# Patient Record
Sex: Male | Born: 1955 | Race: White | Hispanic: No | Marital: Single | Smoking: Never smoker
Health system: Southern US, Community
[De-identification: ages and names within clinical notes are randomized; demographics above are authoritative.]

## PROBLEM LIST (undated history)

## (undated) DIAGNOSIS — I219 Acute myocardial infarction, unspecified: Secondary | ICD-10-CM

## (undated) DIAGNOSIS — N289 Disorder of kidney and ureter, unspecified: Secondary | ICD-10-CM

## (undated) DIAGNOSIS — K859 Acute pancreatitis without necrosis or infection, unspecified: Secondary | ICD-10-CM

## (undated) DIAGNOSIS — I2510 Atherosclerotic heart disease of native coronary artery without angina pectoris: Secondary | ICD-10-CM

## (undated) DIAGNOSIS — E119 Type 2 diabetes mellitus without complications: Secondary | ICD-10-CM

## (undated) HISTORY — PX: AV FISTULA PLACEMENT, BRACHIOBASILIC: SHX1206

## (undated) HISTORY — PX: CARDIAC DEFIBRILLATOR PLACEMENT: SHX171

---

## 2014-09-15 DIAGNOSIS — I219 Acute myocardial infarction, unspecified: Secondary | ICD-10-CM

## 2014-09-15 HISTORY — DX: Acute myocardial infarction, unspecified: I21.9

## 2014-12-02 ENCOUNTER — Emergency Department (HOSPITAL_COMMUNITY): Payer: Medicaid - Out of State

## 2014-12-02 ENCOUNTER — Inpatient Hospital Stay (HOSPITAL_COMMUNITY): Payer: Medicaid - Out of State

## 2014-12-02 ENCOUNTER — Inpatient Hospital Stay (HOSPITAL_COMMUNITY)
Admission: EM | Admit: 2014-12-02 | Discharge: 2014-12-05 | DRG: 280 | Disposition: A | Discharge status: Home / Self Care | Payer: Medicaid - Out of State | Attending: Internal Medicine | Admitting: Internal Medicine

## 2014-12-02 ENCOUNTER — Encounter (HOSPITAL_COMMUNITY): Payer: Self-pay | Admitting: Oncology

## 2014-12-02 DIAGNOSIS — I5022 Chronic systolic (congestive) heart failure: Secondary | ICD-10-CM | POA: Diagnosis present

## 2014-12-02 DIAGNOSIS — I5021 Acute systolic (congestive) heart failure: Secondary | ICD-10-CM

## 2014-12-02 DIAGNOSIS — D638 Anemia in other chronic diseases classified elsewhere: Secondary | ICD-10-CM | POA: Diagnosis present

## 2014-12-02 DIAGNOSIS — Z79899 Other long term (current) drug therapy: Secondary | ICD-10-CM | POA: Diagnosis not present

## 2014-12-02 DIAGNOSIS — I5023 Acute on chronic systolic (congestive) heart failure: Secondary | ICD-10-CM | POA: Diagnosis present

## 2014-12-02 DIAGNOSIS — N19 Unspecified kidney failure: Secondary | ICD-10-CM

## 2014-12-02 DIAGNOSIS — I214 Non-ST elevation (NSTEMI) myocardial infarction: Principal | ICD-10-CM | POA: Insufficient documentation

## 2014-12-02 DIAGNOSIS — R251 Tremor, unspecified: Secondary | ICD-10-CM | POA: Diagnosis present

## 2014-12-02 DIAGNOSIS — Z794 Long term (current) use of insulin: Secondary | ICD-10-CM

## 2014-12-02 DIAGNOSIS — I252 Old myocardial infarction: Secondary | ICD-10-CM | POA: Diagnosis not present

## 2014-12-02 DIAGNOSIS — J810 Acute pulmonary edema: Secondary | ICD-10-CM

## 2014-12-02 DIAGNOSIS — R945 Abnormal results of liver function studies: Secondary | ICD-10-CM

## 2014-12-02 DIAGNOSIS — R0600 Dyspnea, unspecified: Secondary | ICD-10-CM | POA: Diagnosis not present

## 2014-12-02 DIAGNOSIS — R110 Nausea: Secondary | ICD-10-CM | POA: Diagnosis not present

## 2014-12-02 DIAGNOSIS — N183 Chronic kidney disease, stage 3 unspecified: Secondary | ICD-10-CM | POA: Insufficient documentation

## 2014-12-02 DIAGNOSIS — R627 Adult failure to thrive: Secondary | ICD-10-CM | POA: Diagnosis present

## 2014-12-02 DIAGNOSIS — R42 Dizziness and giddiness: Secondary | ICD-10-CM | POA: Diagnosis not present

## 2014-12-02 DIAGNOSIS — I255 Ischemic cardiomyopathy: Secondary | ICD-10-CM | POA: Diagnosis present

## 2014-12-02 DIAGNOSIS — E1122 Type 2 diabetes mellitus with diabetic chronic kidney disease: Secondary | ICD-10-CM | POA: Diagnosis present

## 2014-12-02 DIAGNOSIS — D631 Anemia in chronic kidney disease: Secondary | ICD-10-CM | POA: Diagnosis present

## 2014-12-02 DIAGNOSIS — R112 Nausea with vomiting, unspecified: Secondary | ICD-10-CM | POA: Diagnosis present

## 2014-12-02 DIAGNOSIS — N184 Chronic kidney disease, stage 4 (severe): Secondary | ICD-10-CM | POA: Diagnosis present

## 2014-12-02 DIAGNOSIS — R1111 Vomiting without nausea: Secondary | ICD-10-CM

## 2014-12-02 DIAGNOSIS — I509 Heart failure, unspecified: Secondary | ICD-10-CM

## 2014-12-02 DIAGNOSIS — Z7982 Long term (current) use of aspirin: Secondary | ICD-10-CM

## 2014-12-02 DIAGNOSIS — D649 Anemia, unspecified: Secondary | ICD-10-CM

## 2014-12-02 DIAGNOSIS — Z95810 Presence of automatic (implantable) cardiac defibrillator: Secondary | ICD-10-CM

## 2014-12-02 DIAGNOSIS — R7989 Other specified abnormal findings of blood chemistry: Secondary | ICD-10-CM | POA: Diagnosis not present

## 2014-12-02 DIAGNOSIS — I25119 Atherosclerotic heart disease of native coronary artery with unspecified angina pectoris: Secondary | ICD-10-CM | POA: Diagnosis present

## 2014-12-02 DIAGNOSIS — Z8249 Family history of ischemic heart disease and other diseases of the circulatory system: Secondary | ICD-10-CM | POA: Diagnosis not present

## 2014-12-02 DIAGNOSIS — I2510 Atherosclerotic heart disease of native coronary artery without angina pectoris: Secondary | ICD-10-CM | POA: Diagnosis present

## 2014-12-02 DIAGNOSIS — Z833 Family history of diabetes mellitus: Secondary | ICD-10-CM | POA: Diagnosis not present

## 2014-12-02 DIAGNOSIS — J811 Chronic pulmonary edema: Secondary | ICD-10-CM | POA: Diagnosis present

## 2014-12-02 DIAGNOSIS — R9431 Abnormal electrocardiogram [ECG] [EKG]: Secondary | ICD-10-CM

## 2014-12-02 DIAGNOSIS — R778 Other specified abnormalities of plasma proteins: Secondary | ICD-10-CM

## 2014-12-02 HISTORY — DX: Disorder of kidney and ureter, unspecified: N28.9

## 2014-12-02 HISTORY — DX: Acute pancreatitis without necrosis or infection, unspecified: K85.90

## 2014-12-02 HISTORY — DX: Acute myocardial infarction, unspecified: I21.9

## 2014-12-02 HISTORY — DX: Type 2 diabetes mellitus without complications: E11.9

## 2014-12-02 HISTORY — DX: Atherosclerotic heart disease of native coronary artery without angina pectoris: I25.10

## 2014-12-02 LAB — BRAIN NATRIURETIC PEPTIDE: B Natriuretic Peptide: 1416.1 pg/mL — ABNORMAL HIGH (ref 0.0–100.0)

## 2014-12-02 LAB — COMPREHENSIVE METABOLIC PANEL
ALBUMIN: 4.2 g/dL (ref 3.5–5.0)
ALK PHOS: 102 U/L (ref 38–126)
ALT: 97 U/L — AB (ref 17–63)
ANION GAP: 11 (ref 5–15)
AST: 93 U/L — ABNORMAL HIGH (ref 15–41)
BUN: 70 mg/dL — ABNORMAL HIGH (ref 6–20)
CALCIUM: 9.9 mg/dL (ref 8.9–10.3)
CHLORIDE: 103 mmol/L (ref 101–111)
CO2: 28 mmol/L (ref 22–32)
CREATININE: 2.81 mg/dL — AB (ref 0.61–1.24)
GFR calc Af Amer: 27 mL/min — ABNORMAL LOW (ref >60)
GFR calc non Af Amer: 23 mL/min — ABNORMAL LOW (ref >60)
GLUCOSE: 94 mg/dL (ref 65–99)
Potassium: 3.8 mmol/L (ref 3.5–5.1)
SODIUM: 142 mmol/L (ref 135–145)
Total Bilirubin: 1 mg/dL (ref 0.3–1.2)
Total Protein: 7.6 g/dL (ref 6.5–8.1)

## 2014-12-02 LAB — I-STAT CHEM 8, ED
BUN: 63 mg/dL — ABNORMAL HIGH (ref 6–20)
Calcium, Ion: 1.15 mmol/L (ref 1.13–1.30)
Chloride: 102 mmol/L (ref 101–111)
Creatinine, Ser: 2.8 mg/dL — ABNORMAL HIGH (ref 0.61–1.24)
Glucose, Bld: 93 mg/dL (ref 65–99)
HCT: 30 % — ABNORMAL LOW (ref 39.0–52.0)
HEMOGLOBIN: 10.2 g/dL — AB (ref 13.0–17.0)
POTASSIUM: 3.4 mmol/L — AB (ref 3.5–5.1)
SODIUM: 141 mmol/L (ref 135–145)
TCO2: 27 mmol/L (ref 0–100)

## 2014-12-02 LAB — RAPID URINE DRUG SCREEN, HOSP PERFORMED
AMPHETAMINES: NOT DETECTED
BENZODIAZEPINES: NOT DETECTED
Barbiturates: NOT DETECTED
COCAINE: NOT DETECTED
OPIATES: NOT DETECTED
Tetrahydrocannabinol: NOT DETECTED

## 2014-12-02 LAB — MAGNESIUM: Magnesium: 2.1 mg/dL (ref 1.7–2.4)

## 2014-12-02 LAB — TROPONIN I
TROPONIN I: 1.48 ng/mL — AB (ref <0.031)
Troponin I: 0.61 ng/mL (ref <0.031)
Troponin I: 2.3 ng/mL (ref <0.031)

## 2014-12-02 LAB — CREATININE, SERUM
CREATININE: 2.68 mg/dL — AB (ref 0.61–1.24)
GFR calc Af Amer: 28 mL/min — ABNORMAL LOW (ref >60)
GFR, EST NON AFRICAN AMERICAN: 24 mL/min — AB (ref >60)

## 2014-12-02 LAB — CBC
HCT: 31.8 % — ABNORMAL LOW (ref 39.0–52.0)
HEMOGLOBIN: 10.7 g/dL — AB (ref 13.0–17.0)
MCH: 32 pg (ref 26.0–34.0)
MCHC: 33.6 g/dL (ref 30.0–36.0)
MCV: 95.2 fL (ref 78.0–100.0)
Platelets: 133 10*3/uL — ABNORMAL LOW (ref 150–400)
RBC: 3.34 MIL/uL — ABNORMAL LOW (ref 4.22–5.81)
RDW: 15 % (ref 11.5–15.5)
WBC: 7.1 10*3/uL (ref 4.0–10.5)

## 2014-12-02 LAB — CBG MONITORING, ED
GLUCOSE-CAPILLARY: 181 mg/dL — AB (ref 65–99)
GLUCOSE-CAPILLARY: 206 mg/dL — AB (ref 65–99)
Glucose-Capillary: 171 mg/dL — ABNORMAL HIGH (ref 65–99)

## 2014-12-02 LAB — URINALYSIS, ROUTINE W REFLEX MICROSCOPIC
Bilirubin Urine: NEGATIVE
GLUCOSE, UA: NEGATIVE mg/dL
HGB URINE DIPSTICK: NEGATIVE
Ketones, ur: NEGATIVE mg/dL
Leukocytes, UA: NEGATIVE
Nitrite: NEGATIVE
PH: 6 (ref 5.0–8.0)
PROTEIN: NEGATIVE mg/dL
Specific Gravity, Urine: 1.012 (ref 1.005–1.030)
Urobilinogen, UA: 0.2 mg/dL (ref 0.0–1.0)

## 2014-12-02 LAB — APTT: APTT: 31 s (ref 24–37)

## 2014-12-02 LAB — I-STAT TROPONIN, ED: Troponin i, poc: 0.49 ng/mL (ref 0.00–0.08)

## 2014-12-02 LAB — HEPARIN LEVEL (UNFRACTIONATED): HEPARIN UNFRACTIONATED: 0.23 [IU]/mL — AB (ref 0.30–0.70)

## 2014-12-02 LAB — GLUCOSE, CAPILLARY
GLUCOSE-CAPILLARY: 334 mg/dL — AB (ref 65–99)
Glucose-Capillary: 176 mg/dL — ABNORMAL HIGH (ref 65–99)
Glucose-Capillary: 249 mg/dL — ABNORMAL HIGH (ref 65–99)

## 2014-12-02 LAB — PROTIME-INR
INR: 1.02 (ref 0.00–1.49)
PROTHROMBIN TIME: 13.6 s (ref 11.6–15.2)

## 2014-12-02 LAB — LIPASE, BLOOD: Lipase: 16 U/L — ABNORMAL LOW (ref 22–51)

## 2014-12-02 MED ORDER — ASPIRIN 81 MG PO CHEW
324.0000 mg | CHEWABLE_TABLET | Freq: Once | ORAL | Status: AC
  Administered 2014-12-02: 324 mg via ORAL
  Filled 2014-12-02: qty 4

## 2014-12-02 MED ORDER — INSULIN DETEMIR 100 UNIT/ML ~~LOC~~ SOLN
5.0000 [IU] | Freq: Two times a day (BID) | SUBCUTANEOUS | Status: DC
  Administered 2014-12-02 – 2014-12-05 (×6): 5 [IU] via SUBCUTANEOUS
  Filled 2014-12-02 (×8): qty 0.05

## 2014-12-02 MED ORDER — FLUOXETINE HCL 20 MG PO CAPS
40.0000 mg | ORAL_CAPSULE | Freq: Every day | ORAL | Status: DC
  Administered 2014-12-03 – 2014-12-05 (×3): 40 mg via ORAL
  Filled 2014-12-02 (×4): qty 2

## 2014-12-02 MED ORDER — ALBUTEROL SULFATE (2.5 MG/3ML) 0.083% IN NEBU
5.0000 mg | INHALATION_SOLUTION | Freq: Once | RESPIRATORY_TRACT | Status: AC
  Administered 2014-12-02: 5 mg via RESPIRATORY_TRACT
  Filled 2014-12-02: qty 6

## 2014-12-02 MED ORDER — SODIUM CHLORIDE 0.9 % IJ SOLN
3.0000 mL | Freq: Two times a day (BID) | INTRAMUSCULAR | Status: DC
  Administered 2014-12-05: 3 mL via INTRAVENOUS

## 2014-12-02 MED ORDER — PANCRELIPASE (LIP-PROT-AMYL) 6000-19000 UNITS PO CPEP
6000.0000 [IU] | ORAL_CAPSULE | ORAL | Status: DC
  Administered 2014-12-03 – 2014-12-05 (×3): 6000 [IU] via ORAL

## 2014-12-02 MED ORDER — BISACODYL 10 MG RE SUPP: 10.0000 mg | Freq: Every day | RECTAL | Status: DC | PRN

## 2014-12-02 MED ORDER — CALCITRIOL 0.5 MCG PO CAPS
0.5000 ug | ORAL_CAPSULE | Freq: Every day | ORAL | Status: DC
  Administered 2014-12-03 – 2014-12-05 (×3): 0.5 ug via ORAL
  Filled 2014-12-02 (×4): qty 1

## 2014-12-02 MED ORDER — PANCRELIPASE (LIP-PROT-AMYL) 6000-19000 UNITS PO CPEP: 6000.0000 [IU] | ORAL_CAPSULE | Freq: Two times a day (BID) | ORAL | Status: DC

## 2014-12-02 MED ORDER — FUROSEMIDE 10 MG/ML IJ SOLN
40.0000 mg | Freq: Once | INTRAMUSCULAR | Status: AC
  Administered 2014-12-02: 40 mg via INTRAVENOUS
  Filled 2014-12-02: qty 4

## 2014-12-02 MED ORDER — ASPIRIN EC 81 MG PO TBEC
81.0000 mg | DELAYED_RELEASE_TABLET | Freq: Every day | ORAL | Status: DC
  Administered 2014-12-03 – 2014-12-05 (×3): 81 mg via ORAL
  Filled 2014-12-02 (×3): qty 1

## 2014-12-02 MED ORDER — ACETAMINOPHEN 650 MG RE SUPP: 650.0000 mg | Freq: Four times a day (QID) | RECTAL | Status: DC | PRN

## 2014-12-02 MED ORDER — PANCRELIPASE (LIP-PROT-AMYL) 12000-38000 UNITS PO CPEP: 6000.0000 [IU] | ORAL_CAPSULE | Freq: Three times a day (TID) | ORAL | Status: DC

## 2014-12-02 MED ORDER — ACETAMINOPHEN 325 MG PO TABS: 650.0000 mg | ORAL_TABLET | Freq: Four times a day (QID) | ORAL | Status: DC | PRN

## 2014-12-02 MED ORDER — MORPHINE SULFATE (PF) 2 MG/ML IV SOLN
1.0000 mg | INTRAVENOUS | Status: DC | PRN
  Administered 2014-12-02 – 2014-12-04 (×2): 1 mg via INTRAVENOUS
  Filled 2014-12-02 (×3): qty 1

## 2014-12-02 MED ORDER — PANCRELIPASE (LIP-PROT-AMYL) 12000-38000 UNITS PO CPEP
12000.0000 [IU] | ORAL_CAPSULE | Freq: Every day | ORAL | Status: DC
  Administered 2014-12-02: 12000 [IU] via ORAL
  Filled 2014-12-02 (×3): qty 1

## 2014-12-02 MED ORDER — INSULIN ASPART 100 UNIT/ML ~~LOC~~ SOLN
0.0000 [IU] | Freq: Three times a day (TID) | SUBCUTANEOUS | Status: DC
  Administered 2014-12-02 – 2014-12-03 (×3): 3 [IU] via SUBCUTANEOUS
  Administered 2014-12-03: 5 [IU] via SUBCUTANEOUS
  Administered 2014-12-05: 1 [IU] via SUBCUTANEOUS
  Administered 2014-12-05: 3 [IU] via SUBCUTANEOUS
  Filled 2014-12-02: qty 1

## 2014-12-02 MED ORDER — HEPARIN (PORCINE) IN NACL 100-0.45 UNIT/ML-% IJ SOLN
900.0000 [IU]/h | INTRAMUSCULAR | Status: DC
  Administered 2014-12-02: 700 [IU]/h via INTRAVENOUS
  Filled 2014-12-02 (×2): qty 250

## 2014-12-02 MED ORDER — MIDODRINE HCL 5 MG PO TABS
10.0000 mg | ORAL_TABLET | Freq: Three times a day (TID) | ORAL | Status: DC
  Administered 2014-12-02 – 2014-12-05 (×9): 10 mg via ORAL
  Filled 2014-12-02 (×13): qty 2

## 2014-12-02 MED ORDER — LORAZEPAM 1 MG PO TABS
1.0000 mg | ORAL_TABLET | Freq: Four times a day (QID) | ORAL | Status: AC | PRN
  Administered 2014-12-02: 1 mg via ORAL
  Filled 2014-12-02: qty 1

## 2014-12-02 MED ORDER — PANCRELIPASE (LIP-PROT-AMYL) 12000-38000 UNITS PO CPEP
12000.0000 [IU] | ORAL_CAPSULE | Freq: Every day | ORAL | Status: DC
  Filled 2014-12-02: qty 1

## 2014-12-02 MED ORDER — CARVEDILOL 3.125 MG PO TABS
3.1250 mg | ORAL_TABLET | Freq: Two times a day (BID) | ORAL | Status: DC
  Administered 2014-12-02 – 2014-12-05 (×6): 3.125 mg via ORAL
  Filled 2014-12-02 (×9): qty 1

## 2014-12-02 MED ORDER — ONDANSETRON HCL 4 MG/2ML IJ SOLN
4.0000 mg | Freq: Four times a day (QID) | INTRAMUSCULAR | Status: DC | PRN
  Administered 2014-12-03 – 2014-12-04 (×2): 4 mg via INTRAVENOUS
  Filled 2014-12-02 (×2): qty 2

## 2014-12-02 MED ORDER — ONDANSETRON HCL 4 MG PO TABS: 4.0000 mg | ORAL_TABLET | Freq: Four times a day (QID) | ORAL | Status: DC | PRN

## 2014-12-02 MED ORDER — ONDANSETRON HCL 4 MG/2ML IJ SOLN
4.0000 mg | Freq: Once | INTRAMUSCULAR | Status: AC | PRN
  Administered 2014-12-02: 4 mg via INTRAVENOUS
  Filled 2014-12-02: qty 2

## 2014-12-02 MED ORDER — VITAMIN B-1 100 MG PO TABS
100.0000 mg | ORAL_TABLET | Freq: Every day | ORAL | Status: DC
  Administered 2014-12-03 – 2014-12-05 (×3): 100 mg via ORAL
  Filled 2014-12-02 (×4): qty 1

## 2014-12-02 MED ORDER — ALBUTEROL SULFATE (2.5 MG/3ML) 0.083% IN NEBU: 2.5000 mg | INHALATION_SOLUTION | RESPIRATORY_TRACT | Status: DC | PRN

## 2014-12-02 MED ORDER — HEPARIN SODIUM (PORCINE) 5000 UNIT/ML IJ SOLN
5000.0000 [IU] | Freq: Three times a day (TID) | INTRAMUSCULAR | Status: DC
  Filled 2014-12-02 (×3): qty 1

## 2014-12-02 MED ORDER — NITROGLYCERIN 0.4 MG SL SUBL: 0.4000 mg | SUBLINGUAL_TABLET | SUBLINGUAL | Status: DC | PRN

## 2014-12-02 MED ORDER — POLYETHYLENE GLYCOL 3350 17 G PO PACK
17.0000 g | PACK | Freq: Two times a day (BID) | ORAL | Status: DC
  Administered 2014-12-02 – 2014-12-05 (×4): 17 g via ORAL
  Filled 2014-12-02 (×7): qty 1

## 2014-12-02 MED ORDER — SODIUM CHLORIDE 0.9 % IJ SOLN
3.0000 mL | Freq: Two times a day (BID) | INTRAMUSCULAR | Status: DC
  Administered 2014-12-03 – 2014-12-04 (×2): 3 mL via INTRAVENOUS

## 2014-12-02 MED ORDER — ATORVASTATIN CALCIUM 10 MG PO TABS
10.0000 mg | ORAL_TABLET | Freq: Every day | ORAL | Status: DC
  Administered 2014-12-02 – 2014-12-04 (×3): 10 mg via ORAL
  Filled 2014-12-02 (×4): qty 1

## 2014-12-02 MED ORDER — HEPARIN BOLUS VIA INFUSION
3500.0000 [IU] | Freq: Once | INTRAVENOUS | Status: AC
  Administered 2014-12-02: 3500 [IU] via INTRAVENOUS
  Filled 2014-12-02: qty 3500

## 2014-12-02 MED ORDER — SODIUM CHLORIDE 0.9 % IJ SOLN: 3.0000 mL | INTRAMUSCULAR | Status: DC | PRN

## 2014-12-02 MED ORDER — SODIUM CHLORIDE 0.9 % IV SOLN: 250.0000 mL | INTRAVENOUS | Status: DC | PRN

## 2014-12-02 MED ORDER — ADULT MULTIVITAMIN W/MINERALS CH
1.0000 | ORAL_TABLET | Freq: Every day | ORAL | Status: DC
  Administered 2014-12-03 – 2014-12-05 (×3): 1 via ORAL
  Filled 2014-12-02 (×4): qty 1

## 2014-12-02 MED ORDER — LORAZEPAM 2 MG/ML IJ SOLN
1.0000 mg | Freq: Four times a day (QID) | INTRAMUSCULAR | Status: AC | PRN
  Filled 2014-12-02: qty 1

## 2014-12-02 MED ORDER — FOLIC ACID 1 MG PO TABS
1.0000 mg | ORAL_TABLET | Freq: Every day | ORAL | Status: DC
  Administered 2014-12-03 – 2014-12-05 (×3): 1 mg via ORAL
  Filled 2014-12-02 (×4): qty 1

## 2014-12-02 MED ORDER — HYDROCODONE-ACETAMINOPHEN 5-325 MG PO TABS
1.0000 | ORAL_TABLET | ORAL | Status: DC | PRN
  Administered 2014-12-02: 2 via ORAL
  Administered 2014-12-03 – 2014-12-04 (×3): 1 via ORAL
  Filled 2014-12-02 (×2): qty 1
  Filled 2014-12-02: qty 2
  Filled 2014-12-02 (×2): qty 1
  Filled 2014-12-02 (×2): qty 2

## 2014-12-02 MED ORDER — PANTOPRAZOLE SODIUM 40 MG IV SOLR
40.0000 mg | Freq: Two times a day (BID) | INTRAVENOUS | Status: DC
  Administered 2014-12-02 – 2014-12-05 (×6): 40 mg via INTRAVENOUS
  Filled 2014-12-02 (×7): qty 40

## 2014-12-02 MED ORDER — DOCUSATE SODIUM 100 MG PO CAPS
100.0000 mg | ORAL_CAPSULE | Freq: Two times a day (BID) | ORAL | Status: DC
  Administered 2014-12-02 – 2014-12-05 (×6): 100 mg via ORAL
  Filled 2014-12-02 (×7): qty 1

## 2014-12-02 NOTE — ED Notes (Signed)
Per pt's sister pt began vomiting approximately half an hour ago.  Pt c/o nausea, denies pain.

## 2014-12-02 NOTE — Progress Notes (Signed)
CRITICAL VALUE ALERT  Critical value received: troponin  Date of notification:  12/02/14 Time of notification:  1620  Critical value read back:Yes.    Nurse who received alert:  Debroah Loop  MD notified (1st page):  [place]  Time of first page:  1628  MD notified (2nd page):  Time of second page:  Responding MD:  Time MD responded:

## 2014-12-02 NOTE — Progress Notes (Signed)
MD made aware of critical troponin- cardiology also made aware.  No new orders, will continue to monitor closely.

## 2014-12-02 NOTE — Progress Notes (Signed)
*  PRELIMINARY RESULTS* Echocardiogram 2D Echocardiogram has been performed.  Bradley [name] 12/02/2014, 3:18 PM

## 2014-12-02 NOTE — H&P (Signed)
[facility] History and Physical  Bradley [name] QQV:[id number] DOB: March 16, 1955 DOA: 12/02/2014  Referring physician: Dr Bradley [name] PCP: No primary care provider on file.   Chief Complaint: SBO, dizziness.   HPI: Bradley [name] is [name] 59 y.o. male from [place], PMH significant for  CAD, S/P defibrillator place 10-05  CKD , S/P AV fistula, he is not  On dialysis, he was on his way to [facility] to take his brother to the airport when he started to feel sick. He report SOB, dizziness, nausea. He denies chest pain, abdominal pain. Last BM was day prior to admission, no diarrhea. He has been compliant with his insulin, lasix. He denies drinking alcohol. His brother confirm that patient does not drink alcohol.  He had defibrillator place 11-19-2014.  He report pain at defibrillator site.   Evaluation in the ED; Chest X ray: Mild vascular congestion noted. Increased interstitial markings raise concern for mild interstitial edema. BNP 1416, troponin 0.49, Cr at 2.8. EKG with TW abnormalities inferior and anterior lead. No prior record  Available.    Review of Systems:  Negative, except as per HPI  Past Medical History  Diagnosis Date  . Diabetes mellitus without complication (HCC)     Type II  . MI (myocardial infarction) (HCC) 8/16  . Renal disorder     CKD  . CAD (coronary artery disease)   . Pancreatitis    Past Surgical History  Procedure Laterality Date  . Cardiac defibrillator placement    . Av fistula placement, brachiobasilic     Social History:  reports that he has never smoked. He has never used smokeless tobacco. He reports that he drinks alcohol. He reports that he does not use illicit drugs.  No Known Allergies  Family History: Father with history of Heart diseases. Brother: DM  Prior to Admission medications   Medication Sig Start Date End Date Taking? Authorizing Provider  aspirin EC 81 MG tablet Take 81 mg by mouth daily.   Yes Historical Provider, MD    atorvastatin (LIPITOR) 10 MG tablet Take 10 mg by mouth daily.   Yes Historical Provider, MD  calcitRIOL (ROCALTROL) 0.25 MCG capsule Take 0.5 mcg by mouth daily.   Yes Historical Provider, MD  carvedilol (COREG) 3.125 MG tablet Take 3.125 mg by mouth 2 (two) times daily with [name] meal.   Yes Historical Provider, MD  famotidine (PEPCID) 20 MG tablet Take 20 mg by mouth daily.   Yes Historical Provider, MD  ferrous sulfate 325 (65 FE) MG tablet Take 325 mg by mouth 2 (two) times daily with [name] meal.   Yes Historical Provider, MD  FLUoxetine (PROZAC) 40 MG capsule Take 40 mg by mouth daily.   Yes Historical Provider, MD  furosemide (LASIX) 20 MG tablet Take 20 mg by mouth daily.   Yes Historical Provider, MD  insulin aspart (NOVOLOG FLEXPEN) 100 UNIT/ML FlexPen Inject 5 Units into the skin 3 (three) times daily with meals. 5 units at each meal plus sliding scale.   Yes Historical Provider, MD  insulin detemir (LEVEMIR) 100 UNIT/ML injection Inject 7-9 Units into the skin 2 (two) times daily. 9 units in the morning and 7 units in the evening   Yes Historical Provider, MD  isosorbide mononitrate (IMDUR) 30 MG 24 hr tablet Take 30 mg by mouth daily.   Yes Historical Provider, MD  lipase/protease/amylase (CREON) 12000 UNITS CPEP capsule Take 6,000 Units by mouth 3 (three) times daily before meals. 2 capsules before largest meal and 1  capsule before other meals   Yes Historical Provider, MD  magnesium oxide (MAG-OX) 400 MG tablet Take 400 mg by mouth 2 (two) times daily.   Yes Historical Provider, MD  midodrine (PROAMATINE) 5 MG tablet Take 10 mg by mouth 3 (three) times daily with meals.   Yes Historical Provider, MD  potassium chloride (K-DUR) 10 MEQ tablet Take 10 mEq by mouth 2 (two) times daily.   Yes Historical Provider, MD   Physical Exam: Filed Vitals:   12/02/14 0700 12/02/14 0730 12/02/14 0800 12/02/14 0830  BP: 111/85 116/58 117/59 118/61  Pulse: 95 105 104 103  Temp:      TempSrc:      Resp:  SpO2: 99% 100% 100% 100%    Wt Readings from Last 3 Encounters:  No data found for Wt    General:  Appears calm and comfortable Eyes: PERRL, normal lids, irises & conjunctiva ENT: grossly normal hearing, lips & tongue Neck: no LAD, masses or thyromegaly Cardiovascular: RRR, no m/r/g. No LE edema.  Telemetry: SR, no arrhythmias  Respiratory:  Normal respiratory effort. Crackles bases Abdomen: soft, ntnd Skin: no rash or induration seen on limited exam Musculoskeletal: grossly normal tone BUE/BLE Psychiatric: grossly normal mood and affect, speech fluent and appropriate Neurologic: grossly non-focal. Fine tremor upper extremity.           Labs on Admission:  Basic Metabolic Panel:  Recent Labs Lab 12/02/14 0401 12/02/14 0603  NA 142 141  K 3.8 3.4*  CL 103 102  CO2 28  --   GLUCOSE 94 93  BUN 70* 63*  CREATININE 2.81* 2.80*  CALCIUM 9.9  --    Liver Function Tests:  Recent Labs Lab 12/02/14 0401  AST 93*  ALT 97*  ALKPHOS 102  BILITOT 1.0  PROT 7.6  ALBUMIN 4.2    Recent Labs Lab 12/02/14 0401  LIPASE 16*   No results for input(s): AMMONIA in the last 168 hours. CBC:  Recent Labs Lab 12/02/14 0401 12/02/14 0603  WBC 7.1  --   HGB 10.7* 10.2*  HCT 31.8* 30.0*  MCV 95.2  --   PLT 133*  --    Cardiac Enzymes: No results for input(s): CKTOTAL, CKMB, CKMBINDEX, TROPONINI in the last 168 hours.  BNP (last 3 results)  Recent Labs  12/02/14 0401  BNP 1416.1*    ProBNP (last 3 results) No results for input(s): PROBNP in the last 8760 hours.  CBG:  Recent Labs Lab 12/02/14 0830  GLUCAP 171*    Radiological Exams on Admission: Dg Abd Acute W/chest  12/02/2014  CLINICAL DATA:  Acute onset of nausea, vomiting and weakness. Initial encounter. EXAM: DG ABDOMEN ACUTE W/ 1V CHEST COMPARISON:  None. FINDINGS: The lungs are well-aerated. Mild vascular congestion is noted. Increased interstitial markings raise concern for mild  interstitial edema. There is no evidence of pleural effusion or pneumothorax. The cardiomediastinal silhouette is borderline normal in size. An AICD is noted overlying the left chest wall, with [name] single lead ending overlying the right ventricle. The visualized bowel gas pattern is unremarkable. Scattered stool and air are seen within the colon; there is no evidence of small bowel dilatation to suggest obstruction. No free intra-abdominal air is identified on the provided decubitus view. No acute osseous abnormalities are seen; the sacroiliac joints are unremarkable in appearance. Diffuse vascular calcifications are seen. IMPRESSION: 1. Mild vascular congestion noted. Increased interstitial markings raise concern for mild interstitial edema. 2. Unremarkable bowel  gas pattern; no free intra-abdominal air seen. Moderate amount of stool noted in the colon. 3. Diffuse vascular calcifications seen. Electronically Signed   By: Roanna Raider M.D.   On: 12/02/2014 06:14    EKG: Independently reviewed. T changes anterior and inferior leads.   Assessment/Plan Active Problems:   Pulmonary edema   Heart failure (HCC)   Anemia   Renal failure   Elevated troponin  1-Acute Heart failure exacerbation: suspect systolic.  S/P defibrillator.  Presents with Dyspnea, elevated BNP, Chest x ray with vascular congestion.  Received 40 mg IV lasix in the ED.  Will request records.  Cardiology consulted.   2-Elevated troponin, abnormal EKG; report dyspnea. Has history of CAD. I have ordered to request prior records.  -continue to cycle enzymes.  -ECHO ordered.  continue with aspirin, statin, nitroglycerin PRN.  -Cardiology consulted.   3-CKD stage IV; on current labs, no prior baseline. Monitor on IV lasix.   4-Diabetes: Levemir lower dose in case he is not able to tolerates diet. SSI.   5-Vomiting; KUB with constipation. Zofran PRN. IV protonix. He is on pancreatic enzymes. Lipase was negative. Bowel regime.  Unclear if history of alcohol. Per ED physician patient mention to the nurse that he drinks alcohol. Monitor for alcohol withdrawal. Patient denies to me alcohol intake. He has mild tremors.   6-Anemia; suspect anemia of CKD. Follow hb.   Code Status: presume Full Code.  DVT Prophylaxis: Heparin Family Communication: care discussed with patient and brother who was at bedside.  Disposition Plan: expect 3 to 4 days inpatient.   Time spent: 75 minutes.   Bradley [name] [facility] Pager [phone]

## 2014-12-02 NOTE — ED Provider Notes (Addendum)
CSN: [id number]     Arrival date & time 12/02/14  0306 History   First MD Initiated Contact with Patient 12/02/14 0448     Chief Complaint  Patient presents with  . Emesis     (Consider location/radiation/quality/duration/timing/severity/associated sxs/prior Treatment) Patient is a 59 y.o. male presenting with vomiting. The history is provided by the patient and the spouse.  Emesis Severity:  Moderate Duration:  1 hour Timing:  Intermittent Quality:  Stomach contents Progression:  Unchanged Chronicity:  New Recent urination:  Normal Context: not post-tussive   Relieved by:  Nothing Worsened by:  Nothing tried Ineffective treatments:  None tried Associated symptoms: no abdominal pain, no chills, no cough and no diarrhea   Risk factors: no sick contacts     Past Medical History  Diagnosis Date  . Diabetes mellitus without complication (HCC)     Type II  . MI (myocardial infarction) (HCC) 8/16  . Renal disorder     CKD  . CAD (coronary artery disease)   . Pancreatitis    Past Surgical History  Procedure Laterality Date  . Cardiac defibrillator placement    . Av fistula placement, brachiobasilic     History reviewed. No pertinent family history. Social History  Substance Use Topics  . Smoking status: Never Smoker   . Smokeless tobacco: Never Used  . Alcohol Use: Yes    Review of Systems  Constitutional: Negative for chills.  Cardiovascular: Negative for chest pain, palpitations and leg swelling.  Gastrointestinal: Positive for vomiting. Negative for abdominal pain and diarrhea.  All other systems reviewed and are negative.     Allergies  Review of patient's allergies indicates no known allergies.  Home Medications   Prior to Admission medications   Medication Sig Start Date End Date Taking? Authorizing Provider  aspirin EC 81 MG tablet Take 81 mg by mouth daily.   Yes Historical Provider, MD  atorvastatin (LIPITOR) 10 MG tablet Take 10 mg by mouth  daily.   Yes Historical Provider, MD  calcitRIOL (ROCALTROL) 0.25 MCG capsule Take 0.5 mcg by mouth daily.   Yes Historical Provider, MD  carvedilol (COREG) 3.125 MG tablet Take 3.125 mg by mouth 2 (two) times daily with a meal.   Yes Historical Provider, MD  famotidine (PEPCID) 20 MG tablet Take 20 mg by mouth daily.   Yes Historical Provider, MD  ferrous sulfate 325 (65 FE) MG tablet Take 325 mg by mouth 2 (two) times daily with a meal.   Yes Historical Provider, MD  FLUoxetine (PROZAC) 40 MG capsule Take 40 mg by mouth daily.   Yes Historical Provider, MD  furosemide (LASIX) 20 MG tablet Take 20 mg by mouth daily.   Yes Historical Provider, MD  insulin aspart (NOVOLOG FLEXPEN) 100 UNIT/ML FlexPen Inject 5 Units into the skin 3 (three) times daily with meals. 5 units at each meal plus sliding scale.   Yes Historical Provider, MD  insulin detemir (LEVEMIR) 100 UNIT/ML injection Inject 7-9 Units into the skin 2 (two) times daily. 9 units in the morning and 7 units in the evening   Yes Historical Provider, MD  isosorbide mononitrate (IMDUR) 30 MG 24 hr tablet Take 30 mg by mouth daily.   Yes Historical Provider, MD  lipase/protease/amylase (CREON) 12000 UNITS CPEP capsule Take 6,000 Units by mouth 3 (three) times daily before meals. 2 capsules before largest meal and 1 capsule before other meals   Yes Historical Provider, MD  magnesium oxide (MAG-OX) 400 MG tablet Take 400  mg by mouth 2 (two) times daily.   Yes Historical Provider, MD  midodrine (PROAMATINE) 5 MG tablet Take 10 mg by mouth 3 (three) times daily with meals.   Yes Historical Provider, MD  potassium chloride (K-DUR) 10 MEQ tablet Take 10 mEq by mouth 2 (two) times daily.   Yes Historical Provider, MD   BP 112/52 mmHg  Pulse 90  Temp(Src) 98.8 F (37.1 C) (Oral)  Resp 14  SpO2 97% Physical Exam  Constitutional: He appears well-developed and well-nourished.  HENT:  Head: Normocephalic and atraumatic.  Mouth/Throat: Oropharynx is  clear and moist.  Eyes: Conjunctivae are normal. Pupils are equal, round, and reactive to light.  Neck: Normal range of motion. Neck supple.  Cardiovascular: Normal rate, regular rhythm and intact distal pulses.   Pulmonary/Chest: Effort normal. No respiratory distress. He has decreased breath sounds.  Abdominal: Soft. Bowel sounds are normal. There is no tenderness. There is no rebound and no guarding.  Musculoskeletal: Normal range of motion.  Neurological: He is alert. He has normal reflexes.  Skin: Skin is warm and dry. He is not diaphoretic.  Psychiatric: He has a normal mood and affect.    ED Course  Procedures (including critical care time) Labs Review Labs Reviewed  LIPASE, BLOOD - Abnormal; Notable for the following:    Lipase 16 (*)    All other components within normal limits  COMPREHENSIVE METABOLIC PANEL - Abnormal; Notable for the following:    BUN 70 (*)    Creatinine, Ser 2.81 (*)    AST 93 (*)    ALT 97 (*)    GFR calc non Af Amer 23 (*)    GFR calc Af Amer 27 (*)    All other components within normal limits  CBC - Abnormal; Notable for the following:    RBC 3.34 (*)    Hemoglobin 10.7 (*)    HCT 31.8 (*)    Platelets 133 (*)    All other components within normal limits  URINALYSIS, ROUTINE W REFLEX MICROSCOPIC (NOT AT [facility]) - Abnormal; Notable for the following:    APPearance CLOUDY (*)    All other components within normal limits  I-STAT TROPOININ, ED - Abnormal; Notable for the following:    Troponin i, poc 0.49 (*)    All other components within normal limits  I-STAT CHEM 8, ED - Abnormal; Notable for the following:    Potassium 3.4 (*)    BUN 63 (*)    Creatinine, Ser 2.80 (*)    Hemoglobin 10.2 (*)    HCT 30.0 (*)    All other components within normal limits  PROTIME-INR  URINE RAPID DRUG SCREEN, HOSP PERFORMED  BRAIN NATRIURETIC PEPTIDE    Imaging Review Dg Abd Acute W/chest  12/02/2014  CLINICAL DATA:  Acute onset of nausea, vomiting and  weakness. Initial encounter. EXAM: DG ABDOMEN ACUTE W/ 1V CHEST COMPARISON:  None. FINDINGS: The lungs are well-aerated. Mild vascular congestion is noted. Increased interstitial markings raise concern for mild interstitial edema. There is no evidence of pleural effusion or pneumothorax. The cardiomediastinal silhouette is borderline normal in size. An AICD is noted overlying the left chest wall, with a single lead ending overlying the right ventricle. The visualized bowel gas pattern is unremarkable. Scattered stool and air are seen within the colon; there is no evidence of small bowel dilatation to suggest obstruction. No free intra-abdominal air is identified on the provided decubitus view. No acute osseous abnormalities are seen; the sacroiliac joints  are unremarkable in appearance. Diffuse vascular calcifications are seen. IMPRESSION: 1. Mild vascular congestion noted. Increased interstitial markings raise concern for mild interstitial edema. 2. Unremarkable bowel gas pattern; no free intra-abdominal air seen. Moderate amount of stool noted in the colon. 3. Diffuse vascular calcifications seen. Electronically Signed   By: Roanna Raider M.D.   On: 12/02/2014 06:14   I have personally reviewed and evaluated these images and lab results as part of my medical decision-making.   EKG Interpretation   Date/Time:  Tuesday December 02 2014 04:39:21 EDT Ventricular Rate:  93 PR Interval:  182 QRS Duration: 100 QT Interval:  385 QTC Calculation: 479 R Axis:   56 Text Interpretation:  Sinus rhythm non specific st t changes Confirmed by  [facility]  MD, Mileigh Tilley ([id number]) on 12/02/2014 4:47:27 AM      MDM   Final diagnoses:  None   Medications  furosemide (LASIX) injection 40 mg (not administered)  aspirin chewable tablet 324 mg (not administered)  ondansetron (ZOFRAN) injection 4 mg (4 mg Intravenous Given 12/02/14 0413)    Results for orders placed or performed during the hospital encounter of  12/02/14  Lipase, blood  Result Value Ref Range   Lipase 16 (L) 22 - 51 U/L  Comprehensive metabolic panel  Result Value Ref Range   Sodium 142 135 - 145 mmol/L   Potassium 3.8 3.5 - 5.1 mmol/L   Chloride 103 101 - 111 mmol/L   CO2 28 22 - 32 mmol/L   Glucose, Bld 94 65 - 99 mg/dL   BUN 70 (H) 6 - 20 mg/dL   Creatinine, Ser 6.04 (H) 0.61 - 1.24 mg/dL   Calcium 9.9 8.9 - 54.0 mg/dL   Total Protein 7.6 6.5 - 8.1 g/dL   Albumin 4.2 3.5 - 5.0 g/dL   AST 93 (H) 15 - 41 U/L   ALT 97 (H) 17 - 63 U/L   Alkaline Phosphatase 102 38 - 126 U/L   Total Bilirubin 1.0 0.3 - 1.2 mg/dL   GFR calc non Af Amer 23 (L) >60 mL/min   GFR calc Af Amer 27 (L) >60 mL/min   Anion gap 11 5 - 15  CBC  Result Value Ref Range   WBC 7.1 4.0 - 10.5 K/uL   RBC 3.34 (L) 4.22 - 5.81 MIL/uL   Hemoglobin 10.7 (L) 13.0 - 17.0 g/dL   HCT 98.1 (L) 19.1 - 47.8 %   MCV 95.2 78.0 - 100.0 fL   MCH 32.0 26.0 - 34.0 pg   MCHC 33.6 30.0 - 36.0 g/dL   RDW 29.5 62.1 - 30.8 %   Platelets 133 (L) 150 - 400 K/uL  Urinalysis, Routine w reflex microscopic (not at [facility])  Result Value Ref Range   Color, Urine YELLOW YELLOW   APPearance CLOUDY (A) CLEAR   Specific Gravity, Urine 1.012 1.005 - 1.030   pH 6.0 5.0 - 8.0   Glucose, UA NEGATIVE NEGATIVE mg/dL   Hgb urine dipstick NEGATIVE NEGATIVE   Bilirubin Urine NEGATIVE NEGATIVE   Ketones, ur NEGATIVE NEGATIVE mg/dL   Protein, ur NEGATIVE NEGATIVE mg/dL   Urobilinogen, UA 0.2 0.0 - 1.0 mg/dL   Nitrite NEGATIVE NEGATIVE   Leukocytes, UA NEGATIVE NEGATIVE  Protime-INR  Result Value Ref Range   Prothrombin Time 13.6 11.6 - 15.2 seconds   INR 1.02 0.00 - 1.49  Urine rapid drug screen (hosp performed)  Result Value Ref Range   Opiates NONE DETECTED NONE DETECTED   Cocaine NONE DETECTED  NONE DETECTED   Benzodiazepines NONE DETECTED NONE DETECTED   Amphetamines NONE DETECTED NONE DETECTED   Tetrahydrocannabinol NONE DETECTED NONE DETECTED   Barbiturates NONE DETECTED NONE  DETECTED  I-stat troponin, ED  Result Value Ref Range   Troponin i, poc 0.49 (HH) 0.00 - 0.08 ng/mL   Comment NOTIFIED PHYSICIAN    Comment 3          I-Stat Chem 8, ED  Result Value Ref Range   Sodium 141 135 - 145 mmol/L   Potassium 3.4 (L) 3.5 - 5.1 mmol/L   Chloride 102 101 - 111 mmol/L   BUN 63 (H) 6 - 20 mg/dL   Creatinine, Ser 1.61 (H) 0.61 - 1.24 mg/dL   Glucose, Bld 93 65 - 99 mg/dL   Calcium, Ion 0.96 0.45 - 1.30 mmol/L   TCO2 27 0 - 100 mmol/L   Hemoglobin 10.2 (L) 13.0 - 17.0 g/dL   HCT 40.9 (L) 81.1 - 91.4 %   Dg Abd Acute W/chest  12/02/2014  CLINICAL DATA:  Acute onset of nausea, vomiting and weakness. Initial encounter. EXAM: DG ABDOMEN ACUTE W/ 1V CHEST COMPARISON:  None. FINDINGS: The lungs are well-aerated. Mild vascular congestion is noted. Increased interstitial markings raise concern for mild interstitial edema. There is no evidence of pleural effusion or pneumothorax. The cardiomediastinal silhouette is borderline normal in size. An AICD is noted overlying the left chest wall, with a single lead ending overlying the right ventricle. The visualized bowel gas pattern is unremarkable. Scattered stool and air are seen within the colon; there is no evidence of small bowel dilatation to suggest obstruction. No free intra-abdominal air is identified on the provided decubitus view. No acute osseous abnormalities are seen; the sacroiliac joints are unremarkable in appearance. Diffuse vascular calcifications are seen. IMPRESSION: 1. Mild vascular congestion noted. Increased interstitial markings raise concern for mild interstitial edema. 2. Unremarkable bowel gas pattern; no free intra-abdominal air seen. Moderate amount of stool noted in the colon. 3. Diffuse vascular calcifications seen. Electronically Signed   By: Roanna Raider M.D.   On: 12/02/2014 06:14    Medications  ondansetron (ZOFRAN) injection 4 mg (4 mg Intravenous Given 12/02/14 0413)  furosemide (LASIX) injection  40 mg (40 mg Intravenous Given 12/02/14 0651)  aspirin chewable tablet 324 mg (324 mg Oral Given 12/02/14 0651)  albuterol (PROVENTIL) (2.5 MG/3ML) 0.083% nebulizer solution 5 mg (5 mg Nebulization Given 12/02/14 0651)    Per Dr. Sunnie Nielsen admit to stepdown    Shaleah Nissley, MD 12/02/14 7829  Cy Blamer, MD 12/02/14 [phone]

## 2014-12-02 NOTE — ED Notes (Signed)
Critical value:  Troponin 0.61 called to Dr. Sunnie Nielsen

## 2014-12-02 NOTE — ED Notes (Addendum)
Received call from [place].  Normal device function.  We will get a fax in about 10 minutes.

## 2014-12-02 NOTE — Progress Notes (Signed)
ANTICOAGULATION CONSULT NOTE - Follow up  Pharmacy Consult for Heparin Indication: chest pain/ACS  No Known Allergies  Patient Measurements: Height: 5\' 7"  (170.2 cm) Weight: 130 lb (58.968 kg) IBW/kg (Calculated) : 66.1 Heparin Dosing Weight: actual body weight  Vital Signs: Temp: 97.5 F (36.4 C) (10/18 1411) Temp Source: Oral (10/18 1411) BP: 95/51 mmHg (10/18 1808) Pulse Rate: 95 (10/18 1808)  Labs:  Recent Labs  12/02/14 0401 12/02/14 0514 12/02/14 0603 12/02/14 0903 12/02/14 1136 12/02/14 1533 12/02/14 2031  HGB 10.7*  --  10.2*  --   --   --   --   HCT 31.8*  --  30.0*  --   --   --   --   PLT 133*  --   --   --   --   --   --   APTT  --   --   --   --  31  --   --   LABPROT  --  13.6  --   --   --   --   --   INR  --  1.02  --   --   --   --   --   HEPARINUNFRC  --   --   --   --   --   --  0.23*  CREATININE 2.81*  --  2.80* 2.68*  --   --   --   TROPONINI  --   --   --  0.61*  --  1.48*  --     Estimated Creatinine Clearance: 24.5 mL/min (by C-G formula based on Cr of 2.68).   Medical History: Past Medical History  Diagnosis Date  . Diabetes mellitus without complication (HCC)     Type II  . MI (myocardial infarction) (HCC) 8/16  . Renal disorder     CKD  . CAD (coronary artery disease)   . Pancreatitis     Assessment: 59 y/o M with PMH of IDDM, CAD, MI, CKD not on HD, ICM s/p recent ICD insertion who presented to [facility] ED with SOB, dizziness, nausea, and CP. Troponins elevated in ED and EKG c/w coronary ischemia per Cardiology. Patient not on any anticoagulants PTA. Pharmacy consulted to assist with dosing of heparin infusion for ACS.  Today, 12/02/2014:  First heparin level below target range on 700units/hr.   No bleeding reported/documented.  Goal of Therapy:  Heparin level 0.3-0.7 units/ml Monitor platelets by anticoagulation protocol: Yes   Plan:   Increase heparin infusion to 900units/hr.  F/u heparin level in the AM (~7.5  hrs).  Daily heparin level and CBC while on heparin.  Continue to monitor H&H and platelets.  Monitor for s/s of bleeding.  Charolotte Eke, PharmD, pager [phone]. 12/02/2014,9:32 PM.

## 2014-12-02 NOTE — ED Notes (Signed)
Critical troponin noted EDP Palumbo and RN Sherrilyn Rist notified.

## 2014-12-02 NOTE — ED Notes (Signed)
Cardiology MD in room.  Patient may need cardiac cath.  Advised to hold all PO meds at this time.

## 2014-12-02 NOTE — ED Notes (Signed)
MD at bedside cardiology dr Elease Hashimoto

## 2014-12-02 NOTE — Consult Note (Signed)
Patient ID: Bradley [name] MRN: [id number], DOB/AGE: 10/19/1955   Admit date: 12/02/2014   Primary Physician: Dr. Tiajuana Amass ([facility]) Primary Cardiologist:  New (followed at  [facility] in [place])      Dr. Erlene Senters, Cardiologist. Office Phone [phone])                                                                               Fax: [phone]  Pt. Profile:  59 y/o male with IDDM, CAD with reported severe multivessel disease treated medically in [place], CKD not on HD, and chronic systolic CHF/ICM s/p recent ICD insertion, presenting with an ACS.   Problem List  Past Medical History  Diagnosis Date  . Diabetes mellitus without complication (HCC)     Type II  . MI (myocardial infarction) (HCC) 8/16  . Renal disorder     CKD  . CAD (coronary artery disease)   . Pancreatitis     Past Surgical History  Procedure Laterality Date  . Cardiac defibrillator placement    . Av fistula placement, brachiobasilic       Allergies  No Known Allergies  HPI  59 y/o male, from [place], who was traveling through [facility] when he developed acute nausea, dyspnea, dizziness and chest pain, prompting him to present to [facility] ED for evaluation.   He is a very poor historian however, per patient report, he has significant cardiac history. He has CAD with previous MI. He apparently had LHC in August in [place] and was told that he has severe 2 vessel coronary disease.  However he reports there was no intervention due to his CKD and desire to avoid HD. He was placed on medical therapy. He also has ischemic cardiomyopathy/ systolic CHF. He initially wore a LifeVest but recently underwent ICD insertion 2 weeks ago. He also notes a h/o IDDM. He denies tobacco abuse. He reports that he has been fully compliant with meds and denies any ICD shocks.   He and his brother both note that he developed severe nausea + vomiting around 2am today, followed by dizziness, dyspnea and near syncope.  Upon arrival to the ED, he developed left sided chest pressure. He describes it as the feeling of someone sitting on his chest. EKG is c/w coronary ischemia with slight ST elevations in AVR and V1 and diffuse horizontal ST depressions. Vital signs are stable in the ED. BNP is elevated at 1416.1. CXR shows mild vascular congestion/ mild interstitial edema.   Per chart review, is medical therapy for his CAD/CHF consist of ASA 81 mg, atorvastatin 10 mg, Coreg 3.125 mg BID, Imdur 30 mg and Lasix, 20 mg. He is also on Insulin for DM.    Home Medications  Prior to Admission medications   Medication Sig Start Date End Date Taking? Authorizing Provider  aspirin EC 81 MG tablet Take 81 mg by mouth daily.   Yes Historical Provider, MD  atorvastatin (LIPITOR) 10 MG tablet Take 10 mg by mouth daily.   Yes Historical Provider, MD  calcitRIOL (ROCALTROL) 0.25 MCG capsule Take 0.5 mcg by mouth daily.   Yes Historical Provider, MD  carvedilol (COREG) 3.125 MG tablet Take 3.125 mg by  mouth 2 (two) times daily with a meal.   Yes Historical Provider, MD  famotidine (PEPCID) 20 MG tablet Take 20 mg by mouth daily.   Yes Historical Provider, MD  ferrous sulfate 325 (65 FE) MG tablet Take 325 mg by mouth 2 (two) times daily with a meal.   Yes Historical Provider, MD  FLUoxetine (PROZAC) 40 MG capsule Take 40 mg by mouth daily.   Yes Historical Provider, MD  furosemide (LASIX) 20 MG tablet Take 20 mg by mouth daily.   Yes Historical Provider, MD  insulin aspart (NOVOLOG FLEXPEN) 100 UNIT/ML FlexPen Inject 5 Units into the skin 3 (three) times daily with meals. 5 units at each meal plus sliding scale.   Yes Historical Provider, MD  insulin detemir (LEVEMIR) 100 UNIT/ML injection Inject 7-9 Units into the skin 2 (two) times daily. 9 units in the morning and 7 units in the evening   Yes Historical Provider, MD  isosorbide mononitrate (IMDUR) 30 MG 24 hr tablet Take 30 mg by mouth daily.   Yes Historical Provider, MD   lipase/protease/amylase (CREON) 12000 UNITS CPEP capsule Take 6,000 Units by mouth 3 (three) times daily before meals. 2 capsules before largest meal and 1 capsule before other meals   Yes Historical Provider, MD  magnesium oxide (MAG-OX) 400 MG tablet Take 400 mg by mouth 2 (two) times daily.   Yes Historical Provider, MD  midodrine (PROAMATINE) 5 MG tablet Take 10 mg by mouth 3 (three) times daily with meals.   Yes Historical Provider, MD  potassium chloride (K-DUR) 10 MEQ tablet Take 10 mEq by mouth 2 (two) times daily.   Yes Historical Provider, MD    Family History  Family History  Problem Relation Age of Onset  . Hypertension Father     Social History  Social History   Social History  . Marital Status: Single    Spouse Name: N/A  . Number of Children: N/A  . Years of Education: N/A   Occupational History  . Not on file.   Social History Main Topics  . Smoking status: Never Smoker   . Smokeless tobacco: Never Used  . Alcohol Use: Yes  . Drug Use: No  . Sexual Activity: No   Other Topics Concern  . Not on file   Social History Narrative  . No narrative on file     Review of Systems General:  No chills, fever, night sweats or weight changes.  Cardiovascular:  No chest pain, dyspnea on exertion, edema, orthopnea, palpitations, paroxysmal nocturnal dyspnea. Dermatological: No rash, lesions/masses Respiratory: No cough, dyspnea Urologic: No hematuria, dysuria Abdominal:   No nausea, vomiting, diarrhea, bright red blood per rectum, melena, or hematemesis Neurologic:  No visual changes, wkns, changes in mental status. All other systems reviewed and are otherwise negative except as noted above.  Physical Exam  Blood pressure 116/67, pulse 99, temperature 98.8 F (37.1 C), temperature source Oral, resp. rate 24, height 6\' 7"  (2.007 m), weight 130 lb (58.968 kg), SpO2 100 %.  General: Pleasant, no distress, disheveled appearing Psych: Normal affect. Neuro: Alert  and oriented X 3. Moves all extremities spontaneously. HEENT: Normal  Neck: Supple without bruits or JVD. Lungs:  Resp regular and unlabored, CTA. Heart: regular rhythm, slightly tachy rate no s3, s4, or murmurs. + device pocket scar in left upper throax Abdomen: Soft, non-tender, non-distended, BS + x 4.  Extremities: No clubbing, cyanosis or edema. DP/PT/Radials 2+ and equal bilaterally.  Labs  Troponin [facility]  Care Test)  Recent Labs  12/02/14 0504  TROPIPOC 0.49*    Recent Labs  12/02/14 0903  TROPONINI 0.61*   Lab Results  Component Value Date   WBC 7.1 12/02/2014   HGB 10.2* 12/02/2014   HCT 30.0* 12/02/2014   MCV 95.2 12/02/2014   PLT 133* 12/02/2014     Recent Labs Lab 12/02/14 0401 12/02/14 0603 12/02/14 0903  NA 142 141  --   K 3.8 3.4*  --   CL 103 102  --   CO2 28  --   --   BUN 70* 63*  --   CREATININE 2.81* 2.80* 2.68*  CALCIUM 9.9  --   --   PROT 7.6  --   --   BILITOT 1.0  --   --   ALKPHOS 102  --   --   ALT 97*  --   --   AST 93*  --   --   GLUCOSE 94 93  --    No results found for: CHOL, HDL, LDLCALC, TRIG No results found for: DDIMER   Radiology/Studies  Dg Abd Acute W/chest  12/02/2014  CLINICAL DATA:  Acute onset of nausea, vomiting and weakness. Initial encounter. EXAM: DG ABDOMEN ACUTE W/ 1V CHEST COMPARISON:  None. FINDINGS: The lungs are well-aerated. Mild vascular congestion is noted. Increased interstitial markings raise concern for mild interstitial edema. There is no evidence of pleural effusion or pneumothorax. The cardiomediastinal silhouette is borderline normal in size. An AICD is noted overlying the left chest wall, with a single lead ending overlying the right ventricle. The visualized bowel gas pattern is unremarkable. Scattered stool and air are seen within the colon; there is no evidence of small bowel dilatation to suggest obstruction. No free intra-abdominal air is identified on the provided decubitus view. No acute  osseous abnormalities are seen; the sacroiliac joints are unremarkable in appearance. Diffuse vascular calcifications are seen. IMPRESSION: 1. Mild vascular congestion noted. Increased interstitial markings raise concern for mild interstitial edema. 2. Unremarkable bowel gas pattern; no free intra-abdominal air seen. Moderate amount of stool noted in the colon. 3. Diffuse vascular calcifications seen. Electronically Signed   By: Roanna Raider M.D.   On: 12/02/2014 06:14    ECG  Slight ST elevations in AVR and V1 with diffuse horizontal ST depressions    ASSESSMENT AND PLAN  1. ACS:  patient with known CAD history presenting with symptoms, EKG and abnormal troponin elevation c/w ACS. EKG shows slight ST elevation in AVR and V1 and diffuse horizontal ST depressions (? LM/LAD disease). Troponin is abnormal at 0.49 --> 0.61. He is with active CP. We will start IV heparin. The only concern is his renal function. Apparently, prior intervention was avoided as patient wanted to avoid risk of worsening renal function and need for HD. We will need to obtain medical records from hospital in [place]. For now, continue IV heparin + medical therapy for CAD. We will need to readdress possible repeat cath with patient. May need renal consult prior to help with decision making.   2. Ischemic Cardiomyopathy/ Chronic Systolic CHF: s/p recent ICD insertion in [place] 2 weeks ago. Denies any shocks. No h/o syncope. EF apparently must be less than 35%. His sister reports his EF was ~20% post MI. Will try to obtain records from [place]. Will repeat Echo.   3. ICD: [employer] device. Per ED RN report, device interrogation was normal.   4. CKD: Scr on admit was 2.81. Currently  not on HD. No ACE-I/ARBs.   5. IDDM: on insulin. Management per IM.    Signed, Robbie Lis, PA-C 12/02/2014, 10:44 AM  Attending Note:   The patient was seen and examined.  Agree with assessment and plan as noted above.  Changes made  to the above note as needed.  Pt is a difficult historian.  Has some mental disabilities.  Brother and sister were present to help with history. He was not able to give me any specific hx.  I talked to his cardiologist in [place].   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  The LAD calcification is such that normal stenting would not be an option but he would likely require rotoblator - and the contrast needed for that would likely cause complete renal failure. He was treated medically and did well.   as chronic systolic CHF with EF 20%,   Situation complicated by renal insufficiency - Cr = 2.8  Plan:  Conservative therapy with heparin for 48 hours.  Start plavix, ASA 81 mg . Maximal medical therapy for angina and CHF . Probably cannot use ACE-I or ARB because of CKD. Would favor hydralazine and imdur .  Lexiscan myoview in 2 days. Home with family if he does well. His primary cardiologist has asked that we send her copies of the DC and the myoview .    Vesta Mixer, Montez Hageman., MD, [facility] 12/02/2014, 11:34 AM [address]  [address] Office [phone] Pager [phone]

## 2014-12-02 NOTE — Progress Notes (Signed)
ANTICOAGULATION CONSULT NOTE - Initial Consult  Pharmacy Consult for Heparin Indication: chest pain/ACS  No Known Allergies  Patient Measurements: Height: 5\' 7"  (170.2 cm) Weight: 130 lb (58.968 kg) IBW/kg (Calculated) : 66.1 Heparin Dosing Weight: actual body weight  Vital Signs: Temp: 98.8 F (37.1 C) (10/18 0335) Temp Source: Oral (10/18 0335) BP: 116/67 mmHg (10/18 1030) Pulse Rate: 99 (10/18 1030)  Labs:  Recent Labs  12/02/14 0401 12/02/14 0514 12/02/14 0603 12/02/14 0903  HGB 10.7*  --  10.2*  --   HCT 31.8*  --  30.0*  --   PLT 133*  --   --   --   LABPROT  --  13.6  --   --   INR  --  1.02  --   --   CREATININE 2.81*  --  2.80* 2.68*  TROPONINI  --   --   --  0.61*    Estimated Creatinine Clearance: 24.5 mL/min (by C-G formula based on Cr of 2.68).   Medical History: Past Medical History  Diagnosis Date  . Diabetes mellitus without complication (HCC)     Type II  . MI (myocardial infarction) (HCC) 8/16  . Renal disorder     CKD  . CAD (coronary artery disease)   . Pancreatitis     Assessment: 59 y/o M with PMH of IDDM, CAD, MI, CKD not on HD, ICM s/p recent ICD insertion who presented to [facility] ED with SOB, dizziness, nausea, and CP. Troponins elevated in ED and EKG c/w coronary ischemia per Cardiology. Patient not on any anticoagulants PTA. Pharmacy consulted to assist with dosing of heparin infusion for ACS.  Today, 12/02/2014:  Baseline CBC: Hgb low at 10.2, Pltc also somewhat low at 133K  Baseline PT/INR: 13.6/1.02, WNL  Baseline aPTT pending  Goal of Therapy:  Heparin level 0.3-0.7 units/ml Monitor platelets by anticoagulation protocol: Yes   Plan:   Discontinue SQ heparin.  Give 3500 units heparin bolus IV x 1.  Start heparin infusion at 700 units/hr.  Check heparin level in 8 hours.  Daily heparin level and CBC while on heparin.  Continue to monitor H&H and platelets.  Monitor for s/s of bleeding.   Greer Pickerel, PharmD,  BCPS Pager: [phone] 12/02/2014 10:52 AM

## 2014-12-02 NOTE — ED Notes (Signed)
Defibrillator interrogated and transmitted.  Awaiting recall from [facility] (rep)

## 2014-12-03 DIAGNOSIS — N183 Chronic kidney disease, stage 3 unspecified: Secondary | ICD-10-CM | POA: Insufficient documentation

## 2014-12-03 DIAGNOSIS — I214 Non-ST elevation (NSTEMI) myocardial infarction: Secondary | ICD-10-CM | POA: Insufficient documentation

## 2014-12-03 DIAGNOSIS — R627 Adult failure to thrive: Secondary | ICD-10-CM | POA: Insufficient documentation

## 2014-12-03 LAB — GLUCOSE, CAPILLARY
GLUCOSE-CAPILLARY: 284 mg/dL — AB (ref 65–99)
Glucose-Capillary: 226 mg/dL — ABNORMAL HIGH (ref 65–99)
Glucose-Capillary: 146 mg/dL — ABNORMAL HIGH (ref 65–99)
Glucose-Capillary: 186 mg/dL — ABNORMAL HIGH (ref 65–99)

## 2014-12-03 LAB — COMPREHENSIVE METABOLIC PANEL
ALK PHOS: 92 U/L (ref 38–126)
ALT: 57 U/L (ref 17–63)
AST: 42 U/L — AB (ref 15–41)
Albumin: 3.7 g/dL (ref 3.5–5.0)
Anion gap: 12 (ref 5–15)
BUN: 65 mg/dL — AB (ref 6–20)
CALCIUM: 9.4 mg/dL (ref 8.9–10.3)
CO2: 27 mmol/L (ref 22–32)
CREATININE: 2.92 mg/dL — AB (ref 0.61–1.24)
Chloride: 97 mmol/L — ABNORMAL LOW (ref 101–111)
GFR calc non Af Amer: 22 mL/min — ABNORMAL LOW (ref >60)
GFR, EST AFRICAN AMERICAN: 25 mL/min — AB (ref >60)
Glucose, Bld: 258 mg/dL — ABNORMAL HIGH (ref 65–99)
Potassium: 3.4 mmol/L — ABNORMAL LOW (ref 3.5–5.1)
SODIUM: 136 mmol/L (ref 135–145)
Total Bilirubin: 1.3 mg/dL — ABNORMAL HIGH (ref 0.3–1.2)
Total Protein: 6.4 g/dL — ABNORMAL LOW (ref 6.5–8.1)

## 2014-12-03 LAB — HEPARIN LEVEL (UNFRACTIONATED)
HEPARIN UNFRACTIONATED: 0.32 [IU]/mL (ref 0.30–0.70)
Heparin Unfractionated: 0.4 IU/mL (ref 0.30–0.70)

## 2014-12-03 LAB — CBC
HCT: 29.1 % — ABNORMAL LOW (ref 39.0–52.0)
HEMOGLOBIN: 9.6 g/dL — AB (ref 13.0–17.0)
MCH: 31.6 pg (ref 26.0–34.0)
MCHC: 33 g/dL (ref 30.0–36.0)
MCV: 95.7 fL (ref 78.0–100.0)
Platelets: 106 10*3/uL — ABNORMAL LOW (ref 150–400)
RBC: 3.04 MIL/uL — AB (ref 4.22–5.81)
RDW: 15.1 % (ref 11.5–15.5)
WBC: 6.7 10*3/uL (ref 4.0–10.5)

## 2014-12-03 LAB — TROPONIN I
Troponin I: 2.05 ng/mL (ref <0.031)
Troponin I: 2.14 ng/mL (ref <0.031)

## 2014-12-03 MED ORDER — HEPARIN (PORCINE) IN NACL 100-0.45 UNIT/ML-% IJ SOLN
950.0000 [IU]/h | INTRAMUSCULAR | Status: DC
  Administered 2014-12-03: 1000 [IU]/h via INTRAVENOUS
  Administered 2014-12-04: 950 [IU]/h via INTRAVENOUS
  Filled 2014-12-03: qty 250

## 2014-12-03 MED ORDER — SODIUM CHLORIDE 0.9 % IV BOLUS (SEPSIS)
250.0000 mL | Freq: Once | INTRAVENOUS | Status: AC
  Administered 2014-12-03: 250 mL via INTRAVENOUS

## 2014-12-03 MED ORDER — PANCRELIPASE (LIP-PROT-AMYL) 6000-19000 UNITS PO CPEP
12000.0000 [IU] | ORAL_CAPSULE | Freq: Every day | ORAL | Status: DC
  Administered 2014-12-03 – 2014-12-04 (×2): 12000 [IU] via ORAL
  Filled 2014-12-03: qty 2

## 2014-12-03 MED ORDER — CLOPIDOGREL BISULFATE 75 MG PO TABS
75.0000 mg | ORAL_TABLET | Freq: Every day | ORAL | Status: DC
  Administered 2014-12-03 – 2014-12-05 (×3): 75 mg via ORAL
  Filled 2014-12-03 (×3): qty 1

## 2014-12-03 MED ORDER — REGADENOSON 0.4 MG/5ML IV SOLN
0.4000 mg | Freq: Once | INTRAVENOUS | Status: DC
  Filled 2014-12-03: qty 5

## 2014-12-03 NOTE — Progress Notes (Signed)
Patient Profile: 59 y/o male with overall poor health literacy, IDDM, CAD with reported severe multivessel disease treated medically in [place], CKD not on HD, and chronic systolic CHF/ICM s/p recent ICD insertion, presenting with an ACS.   Subjective: Resting comfortably. States he feels better today compared to yesterday. Still with intermittent chest discomfort. No dyspnea.   Objective: Vital signs in last 24 hours: Temp:  [97.5 F (36.4 C)-99.8 F (37.7 C)] 99.8 F (37.7 C) (10/19 0600) Pulse Rate:  [76-105] 76 (10/18 2200) Resp:  [13-24] 20 (10/19 0600) BP: (84-122)/(44-67) 110/54 mmHg (10/19 0600) SpO2:  [93 %-100 %] 98 % (10/19 0600) Weight:  [130 lb (58.968 kg)] 130 lb (58.968 kg) (10/18 1041) Last BM Date: 12/02/14  Intake/Output from previous day: 10/18 0701 - 10/19 0700 In: 480 [P.O.:480] Out: 2200 [Urine:2200] Intake/Output this shift:    Medications Current Facility-Administered Medications  Medication Dose Route Frequency Provider Last Rate Last Dose  . 0.9 %  sodium chloride infusion  250 mL Intravenous PRN Belkys A Regalado, MD      . acetaminophen (TYLENOL) tablet 650 mg  650 mg Oral Q6H PRN Belkys A Regalado, MD       Or  . acetaminophen (TYLENOL) suppository 650 mg  650 mg Rectal Q6H PRN Belkys A Regalado, MD      . albuterol (PROVENTIL) (2.5 MG/3ML) 0.083% nebulizer solution 2.5 mg  2.5 mg Nebulization Q2H PRN Belkys A Regalado, MD      . aspirin EC tablet 81 mg  81 mg Oral Daily Belkys A Regalado, MD      . atorvastatin (LIPITOR) tablet 10 mg  10 mg Oral q1800 Belkys A Regalado, MD   10 mg at 12/02/14 1807  . bisacodyl (DULCOLAX) suppository 10 mg  10 mg Rectal Daily PRN Belkys A Regalado, MD      . calcitRIOL (ROCALTROL) capsule 0.5 mcg  0.5 mcg Oral Daily Belkys A Regalado, MD   0.5 mcg at 12/02/14 1200  . carvedilol (COREG) tablet 3.125 mg  3.125 mg Oral BID WC Belkys A Regalado, MD   3.125 mg at 12/02/14 1815  . docusate sodium (COLACE) capsule 100 mg   100 mg Oral BID Belkys A Regalado, MD   100 mg at 12/02/14 2143  . FLUoxetine (PROZAC) capsule 40 mg  40 mg Oral Daily Belkys A Regalado, MD   40 mg at 12/02/14 1201  . folic acid (FOLVITE) tablet 1 mg  1 mg Oral Daily Belkys A Regalado, MD   1 mg at 12/02/14 1030  . heparin ADULT infusion 100 units/mL (25000 units/250 mL)  1,000 Units/hr Intravenous Continuous Belkys A Regalado, MD 10 mL/hr at 12/03/14 0630 1,000 Units/hr at 12/03/14 0630  . HYDROcodone-acetaminophen (NORCO/VICODIN) 5-325 MG per tablet 1-2 tablet  1-2 tablet Oral Q4H PRN Alba Cory, MD   2 tablet at 12/02/14 1245  . insulin aspart (novoLOG) injection 0-9 Units  0-9 Units Subcutaneous TID WC Belkys A Regalado, MD   3 Units at 12/02/14 1808  . insulin detemir (LEVEMIR) injection 5 Units  5 Units Subcutaneous BID Alba Cory, MD   5 Units at 12/02/14 2157  . lipase/protease/amylase (CREON) capsule 12,000 Units  12,000 Units Oral QAC supper Alba Cory, MD   12,000 Units at 12/02/14 2157  . LORazepam (ATIVAN) tablet 1 mg  1 mg Oral Q6H PRN Belkys A Regalado, MD   1 mg at 12/02/14 1254   Or  . LORazepam (ATIVAN) injection 1 mg  1 mg Intravenous Q6H PRN Belkys A Regalado, MD   1 mg at 12/02/14 1030  . midodrine (PROAMATINE) tablet 10 mg  10 mg Oral TID WC Belkys A Regalado, MD   10 mg at 12/02/14 1810  . morphine 2 MG/ML injection 1 mg  1 mg Intravenous Q4H PRN Belkys A Regalado, MD   1 mg at 12/02/14 1137  . multivitamin with minerals tablet 1 tablet  1 tablet Oral Daily Belkys A Regalado, MD   1 tablet at 12/02/14 1030  . nitroGLYCERIN (NITROSTAT) SL tablet 0.4 mg  0.4 mg Sublingual Q5 min PRN Belkys A Regalado, MD      . ondansetron (ZOFRAN) tablet 4 mg  4 mg Oral Q6H PRN Belkys A Regalado, MD       Or  . ondansetron (ZOFRAN) injection 4 mg  4 mg Intravenous Q6H PRN Belkys A Regalado, MD      . Pancrelipase (Lip-Prot-Amyl) CPEP 6,000 Units  6,000 Units Oral 2 times per day Alba Cory, MD   6,000 Units at  12/02/14 1203  . pantoprazole (PROTONIX) injection 40 mg  40 mg Intravenous Q12H Belkys A Regalado, MD   40 mg at 12/02/14 2143  . polyethylene glycol (MIRALAX / GLYCOLAX) packet 17 g  17 g Oral BID Belkys A Regalado, MD   17 g at 12/02/14 2143  . sodium chloride 0.9 % injection 3 mL  3 mL Intravenous Q12H Belkys A Regalado, MD   3 mL at 12/02/14 1204  . sodium chloride 0.9 % injection 3 mL  3 mL Intravenous Q12H Belkys A Regalado, MD   3 mL at 12/02/14 1202  . sodium chloride 0.9 % injection 3 mL  3 mL Intravenous PRN Belkys A Regalado, MD      . thiamine (VITAMIN B-1) tablet 100 mg  100 mg Oral Daily Belkys A Regalado, MD   100 mg at 12/02/14 1030    PE: General: Pleasant, no distress, disheveled appearing Psych: Normal affect. Neuro: Alert and oriented X 3. Moves all extremities spontaneously. HEENT: Normal Neck: Supple without bruits or JVD. Lungs: Resp regular and unlabored, CTA. Heart: RRR, 1/6 SM along RUSB + device pocket scar in left upper throax Abdomen: Soft, non-tender, non-distended, BS + x 4.  Extremities: No clubbing, cyanosis or edema. DP/PT/Radials 2+ and equal bilaterally.  Lab Results:   Recent Labs  12/02/14 0401 12/02/14 0603 12/03/14 0535  WBC 7.1  --  6.7  HGB 10.7* 10.2* 9.6*  HCT 31.8* 30.0* 29.1*  PLT 133*  --  106*   BMET  Recent Labs  12/02/14 0401 12/02/14 0603 12/02/14 0903  NA 142 141  --   K 3.8 3.4*  --   CL 103 102  --   CO2 28  --   --   GLUCOSE 94 93  --   BUN 70* 63*  --   CREATININE 2.81* 2.80* 2.68*  CALCIUM 9.9  --   --    PT/INR  Recent Labs  12/02/14 0514  LABPROT 13.6  INR 1.02   Cardiac Panel (last 3 results)  Recent Labs  12/02/14 0903 12/02/14 1533 12/02/14 2031  TROPONINI 0.61* 1.48* 2.30*    Studies/Results: 2D Echo 12/02/14 Study Conclusions  - Left ventricle: There is diffuse hypokinesis, more pronounced in the basal inferior, inferoseptal, mid inferior, inferoseptal, anteroseptal  and distal inferior and septal walls. The cavity size was normal. There was mild focal basal hypertrophy of the septum. Systolic function was severely reduced.  The estimated ejection fraction was in the range of 20% to 25%. Wall motion was normal; there were no regional wall motion abnormalities. Features are consistent with a pseudonormal left ventricular filling pattern, with concomitant abnormal relaxation and increased filling pressure (grade 2 diastolic dysfunction). Doppler parameters are consistent with elevated ventricular end-diastolic filling pressure. - Aortic valve: Trileaflet; normal thickness leaflets. There was no regurgitation. - Aortic root: The aortic root was normal in size. - Ascending aorta: The ascending aorta was normal in size. - Mitral valve: Structurally normal valve. There was mild regurgitation. - Left atrium: The atrium was moderately dilated. - Right ventricle: Systolic function was mildly reduced. - Right atrium: Pacer wire or catheter noted in right atrium. - Tricuspid valve: There was mild-moderate regurgitation. - Pulmonic valve: There was no regurgitation. - Pulmonary arteries: Systolic pressure was mildly increased. PA peak pressure: 43 mm Hg (S). - Inferior vena cava: The vessel was normal in size. - Pericardium, extracardiac: There was no pericardial effusion.   Assessment/Plan  Active Problems:   Pulmonary edema   Heart failure (HCC)   Anemia   Renal failure   Elevated troponin   1. NSTEMI: troponin  trend 0.16-->1.48--> 2.30. Plan is for medical management for now given renal function. Continue on IV heparin with plans for stress test, possibly tomorrow. HR and BP are both well controlled. Continue ASA, atorvastatin and Coreg. He was on Imdur as an OP, but BP is too soft to restart. Continue to hold for now.  2. CAD: per his primary cardiologist in [facility], he has a chronic total occlusion of his RCA. The LAD is  heavily calcified and diffusely diseased , Lcx has moderate disease by cath in August. The LAD calcification is such that normal stenting would not be an option but he would likely require rotoblator - and the contrast needed for that would likely cause complete renal failure. Will continue medical therapy as outlined above. Will obtain a stress test tomorrow.   3. Ischemic Cardiomyopathy: EF by echo is 20-25%. He has an ICD. NSR on telemetry with controlled rate. No ventricular arrhthymias. Continue BB therapy with Coreg. His renal function prohibits use of ACE/ARB therapy. His BP will not tolerate addition of hydralazine + nitrate at this time.   4. Chronic Systolic CHF: EF 96-29%. Only tolerable HF med at this time, due to renal function and BP is Coreg. His volume appears stable. Will need to monitor closely. Low sodium diet.   5. CKD: Scr ~2.8. F/u BMP pending. He is followed by a nephrologist in [place]. He has a fistula but not yet on HD.   6. IDDM: management per IM.      LOS: 1 day    Brittainy M. Delmer Islam 12/03/2014 7:42 AM   Attending Note:   The patient was seen and examined.  Agree with assessment and plan as noted above.  Changes made to the above note as needed.  Pain free. Troponins are c/w a NSTEMI. I spoke with his primary cardiologist yesterday Dr. Erlene Senters, Cardiologist.  Office Phone [phone]) Fax: [phone]  She would like copies of the DC summary and the Lexiscan myoview study - will get the [facility] tomorrow   Vesta Mixer, Montez Hageman., MD, [facility] 12/03/2014, 3:24 PM [address]  [address] Office [phone] Pager [phone]

## 2014-12-03 NOTE — Progress Notes (Signed)
ANTICOAGULATION CONSULT NOTE - Follow Up Consult  Pharmacy Consult for Heparin Indication: chest pain/ACS  No Known Allergies  Patient Measurements: Height: 5\' 7"  (170.2 cm) Weight: 130 lb (58.968 kg) IBW/kg (Calculated) : 66.1 Heparin Dosing Weight:   Vital Signs: Temp: 98.9 F (37.2 C) (10/19 1747) Temp Source: Oral (10/19 1747) BP: 127/91 mmHg (10/19 1747) Pulse Rate: 84 (10/19 1434)  Labs:  Recent Labs  12/02/14 0401 12/02/14 0514 12/02/14 0603 12/02/14 0903 12/02/14 1136  12/02/14 2031 12/03/14 0535 12/03/14 1138 12/03/14 1743  HGB 10.7*  --  10.2*  --   --   --   --  9.6*  --   --   HCT 31.8*  --  30.0*  --   --   --   --  29.1*  --   --   PLT 133*  --   --   --   --   --   --  106*  --   --   APTT  --   --   --   --  31  --   --   --   --   --   LABPROT  --  13.6  --   --   --   --   --   --   --   --   INR  --  1.02  --   --   --   --   --   --   --   --   HEPARINUNFRC  --   --   --   --   --   --  0.23* 0.32  --  0.40  CREATININE 2.81*  --  2.80* 2.68*  --   --   --   --  2.92*  --   TROPONINI  --   --   --  0.61*  --   < > 2.30*  --  2.05* 2.14*  < > = values in this interval not displayed.  Estimated Creatinine Clearance: 22.5 mL/min (by C-G formula based on Cr of 2.92).   Medications:  Infusions:  . heparin 1,000 Units/hr (12/03/14 1209)    Assessment: 59 y/o M with PMH of IDDM, CAD, MI, CKD not on HD, ICM s/p recent ICD insertion who presented to [facility] ED with SOB, dizziness, nausea, and CP. Troponins elevated in ED and EKG c/w coronary ischemia per Cardiology. Patient not on any anticoagulants PTA. Pharmacy consulted to assist with dosing of heparin infusion for ACS  Heparin level (Confirmatory) this evening is therapeutic (= 0.4) on 1000 units/hr - trop continue to trend upward  Goal of Therapy:  Heparin level 0.3-0.7 units/ml Monitor platelets by anticoagulation protocol: Yes   Plan:   Continue heparin at 1000 units/hr  Daily heparin  level and CBC - monitor pltc closely  Monitor for bleeding  Juliette Alcide, PharmD, BCPS.   Pager: [phone]  12/03/2014,7:38 PM

## 2014-12-03 NOTE — Progress Notes (Signed)
PROGRESS NOTE  Bradley [name] ZOX:[id number] DOB: [birth year] DOA: 12/02/2014 PCP: No primary care provider on file.  HPI/Recap of past 24 hours: Less intermittent chest pain, denies sob at rest, no dizziness, sister in room  Assessment/Plan: Active Problems:   Pulmonary edema   Heart failure (HCC)   Anemia   Renal failure   Elevated troponin  NSTEMI: Chest pain with elevation of troponin, on heparin drip, troponin trending down, cardiology following, stress test tomorrow  H/o CKD III, cr likely close to baseline  IDDM2: adjust insulin  FTT; very weak and frail, likely will need SNF  Code Status: full  Family Communication: patient   Disposition Plan: pending, likely snf   Consultants:  cardiology  Procedures:  none  Antibiotics:  none   Objective: BP 127/91 mmHg  Pulse 84  Temp(Src) 98.9 F (37.2 C) (Oral)  Resp 18  Ht  (1.702 m)  Wt 130 lb (58.968 kg)  BMI 20.36 kg/m2  SpO2 100%  Intake/Output Summary (Last 24 hours) at 12/03/14 1858 Last data filed at 12/03/14 1737  Gross per 24 hour  Intake   1160 ml  Output   1325 ml  Net   -165 ml   Filed Weights   12/02/14 1041  Weight: 130 lb (58.968 kg)    Exam:   General:  NAD, but very frail  Cardiovascular: RRR  Respiratory: CTABL  Abdomen: Soft/ND/NT, positive BS  Musculoskeletal: No Edema  Neuro: aaox3  Data Reviewed: Basic Metabolic Panel:  Recent Labs Lab 12/02/14 0401 12/02/14 0603 12/02/14 0903 12/03/14 1138  NA 142 141  --  136  K 3.8 3.4*  --  3.4*  CL 103 102  --  97*  CO2 28  --   --  27  GLUCOSE 94 93  --  258*  BUN 70* 63*  --  65*  CREATININE 2.81* 2.80* 2.68* 2.92*  CALCIUM 9.9  --   --  9.4  MG  --   --  2.1  --    Liver Function Tests:  Recent Labs Lab 12/02/14 0401 12/03/14 1138  AST 93* 42*  ALT 97* 57  ALKPHOS 102 92  BILITOT 1.0 1.3*  PROT 7.6 6.4*  ALBUMIN 4.2 3.7    Recent Labs Lab 12/02/14 0401  LIPASE 16*   No results for  input(s): AMMONIA in the last 168 hours. CBC:  Recent Labs Lab 12/02/14 0401 12/02/14 0603 12/03/14 0535  WBC 7.1  --  6.7  HGB 10.7* 10.2* 9.6*  HCT 31.8* 30.0* 29.1*  MCV 95.2  --  95.7  PLT 133*  --  106*   Cardiac Enzymes:    Recent Labs Lab 12/02/14 0903 12/02/14 1533 12/02/14 2031 12/03/14 1138 12/03/14 1743  TROPONINI 0.61* 1.48* 2.30* 2.05* 2.14*   BNP (last 3 results)  Recent Labs  12/02/14 0401  BNP 1416.1*    ProBNP (last 3 results) No results for input(s): PROBNP in the last 8760 hours.  CBG:  Recent Labs Lab 12/02/14 1634 12/02/14 2217 12/03/14 0737 12/03/14 1159 12/03/14 1725  GLUCAP 249* 334* 284* 226* 146*    No results found for this or any previous visit (from the past 240 hour(s)).   Studies: No results found.  Scheduled Meds: . aspirin EC  81 mg Oral Daily  . atorvastatin  10 mg Oral q1800  . calcitRIOL  0.5 mcg Oral Daily  . carvedilol  3.125 mg Oral BID WC  . clopidogrel  75 mg Oral Daily  .  docusate sodium  100 mg Oral BID  . FLUoxetine  40 mg Oral Daily  . folic acid  1 mg Oral Daily  . insulin aspart  0-9 Units Subcutaneous TID WC  . insulin detemir  5 Units Subcutaneous BID  . midodrine  10 mg Oral TID WC  . multivitamin with minerals  1 tablet Oral Daily  . Pancrelipase (Lip-Prot-Amyl)  12,000 Units Oral QAC supper  . Pancrelipase (Lip-Prot-Amyl)  6,000 Units Oral 2 times per day  . pantoprazole (PROTONIX) IV  40 mg Intravenous Q12H  . polyethylene glycol  17 g Oral BID  . [START ON 12/04/2014] regadenoson  0.4 mg Intravenous Once  . sodium chloride  3 mL Intravenous Q12H  . sodium chloride  3 mL Intravenous Q12H  . thiamine  100 mg Oral Daily    Continuous Infusions: . heparin 1,000 Units/hr (12/03/14 1209)     Time spent:  Bradley Stenglein MD, PhD  [facility] Pager [phone]. If 7PM-7AM, please contact night-coverage at [email or web address], password [facility] 12/03/2014, 6:58 PM  LOS: 1 day

## 2014-12-03 NOTE — Progress Notes (Signed)
ANTICOAGULATION CONSULT NOTE - Follow Up Consult  Pharmacy Consult for Heparin Indication: chest pain/ACS  No Known Allergies  Patient Measurements: Height: 5\' 7"  (170.2 cm) Weight: 130 lb (58.968 kg) IBW/kg (Calculated) : 66.1 Heparin Dosing Weight:   Vital Signs: Temp: 98.4 F (36.9 C) (10/18 2200) Temp Source: Oral (10/18 2200) BP: 98/48 mmHg (10/18 2341) Pulse Rate: 76 (10/18 2200)  Labs:  Recent Labs  12/02/14 0401 12/02/14 0514 12/02/14 0603 12/02/14 0903 12/02/14 1136 12/02/14 1533 12/02/14 2031 12/03/14 0535  HGB 10.7*  --  10.2*  --   --   --   --  9.6*  HCT 31.8*  --  30.0*  --   --   --   --  29.1*  PLT 133*  --   --   --   --   --   --  PENDING  APTT  --   --   --   --  31  --   --   --   LABPROT  --  13.6  --   --   --   --   --   --   INR  --  1.02  --   --   --   --   --   --   HEPARINUNFRC  --   --   --   --   --   --  0.23* 0.32  CREATININE 2.81*  --  2.80* 2.68*  --   --   --   --   TROPONINI  --   --   --  0.61*  --  1.48* 2.30*  --     Estimated Creatinine Clearance: 24.5 mL/min (by C-G formula based on Cr of 2.68).   Medications:  Infusions:  . heparin      Assessment: Patient with heparin at goal.  No heparin issues per RN. While heparin at goal, will plan to increase to keep within goal.  Goal of Therapy:  Heparin level 0.3-0.7 units/ml Monitor platelets by anticoagulation protocol: Yes   Plan:  Increase heparin to 1000 units/hr Recheck level at 1500  [address], [place] 12/03/2014,6:26 AM

## 2014-12-03 NOTE — Progress Notes (Signed)
Inpatient Diabetes Program Recommendations  AACE/ADA: New Consensus Statement on Inpatient Glycemic Control (2015)  Target Ranges:  Prepandial:   less than 140 mg/dL      Peak postprandial:   less than 180 mg/dL (1-2 hours)      Critically ill patients:  140 - 180 mg/dL   Review of Glycemic Control  At home-In addition to levemir 7 units in the am and 9 units in the pm, pt takes novolog 5 units meal coverage tidwc.Inpatient Diabetes Program Recommendations:  Insulin - Meal Coverage: Please consider addition of meal coverage novolog 3 units tidwc   Thank you Lenor Coffin, RN, MSN, CDE  Diabetes Inpatient Program Office: [phone] Pager: [phone] 8:00 am to 5:00 pm

## 2014-12-04 ENCOUNTER — Inpatient Hospital Stay (HOSPITAL_COMMUNITY)
Admission: EM | Admit: 2014-12-04 | Discharge: 2014-12-04 | Disposition: A | Discharge status: Home / Self Care | Payer: Medicaid - Out of State | Source: Home / Self Care | Attending: Cardiovascular Disease | Admitting: Cardiovascular Disease

## 2014-12-04 ENCOUNTER — Ambulatory Visit (HOSPITAL_COMMUNITY)
Admission: EM | Admit: 2014-12-04 | Discharge: 2014-12-04 | Disposition: A | Discharge status: Home / Self Care | Payer: Medicaid - Out of State | Attending: Cardiovascular Disease | Admitting: Cardiovascular Disease

## 2014-12-04 ENCOUNTER — Inpatient Hospital Stay (HOSPITAL_COMMUNITY): Payer: Medicaid - Out of State

## 2014-12-04 DIAGNOSIS — I252 Old myocardial infarction: Secondary | ICD-10-CM | POA: Insufficient documentation

## 2014-12-04 DIAGNOSIS — I2510 Atherosclerotic heart disease of native coronary artery without angina pectoris: Secondary | ICD-10-CM | POA: Diagnosis not present

## 2014-12-04 DIAGNOSIS — R42 Dizziness and giddiness: Secondary | ICD-10-CM | POA: Insufficient documentation

## 2014-12-04 DIAGNOSIS — R110 Nausea: Secondary | ICD-10-CM | POA: Insufficient documentation

## 2014-12-04 LAB — GLUCOSE, CAPILLARY
GLUCOSE-CAPILLARY: 384 mg/dL — AB (ref 65–99)
Glucose-Capillary: 108 mg/dL — ABNORMAL HIGH (ref 65–99)
Glucose-Capillary: 93 mg/dL (ref 65–99)

## 2014-12-04 LAB — CBC
HEMATOCRIT: 29.2 % — AB (ref 39.0–52.0)
HEMOGLOBIN: 9.7 g/dL — AB (ref 13.0–17.0)
MCH: 32.1 pg (ref 26.0–34.0)
MCHC: 33.2 g/dL (ref 30.0–36.0)
MCV: 96.7 fL (ref 78.0–100.0)
Platelets: 101 10*3/uL — ABNORMAL LOW (ref 150–400)
RBC: 3.02 MIL/uL — ABNORMAL LOW (ref 4.22–5.81)
RDW: 15.3 % (ref 11.5–15.5)
WBC: 6.1 10*3/uL (ref 4.0–10.5)

## 2014-12-04 LAB — BASIC METABOLIC PANEL
Anion gap: 9 (ref 5–15)
BUN: 67 mg/dL — AB (ref 6–20)
CHLORIDE: 101 mmol/L (ref 101–111)
CO2: 28 mmol/L (ref 22–32)
Calcium: 9.2 mg/dL (ref 8.9–10.3)
Creatinine, Ser: 2.9 mg/dL — ABNORMAL HIGH (ref 0.61–1.24)
GFR calc Af Amer: 26 mL/min — ABNORMAL LOW (ref >60)
GFR calc non Af Amer: 22 mL/min — ABNORMAL LOW (ref >60)
GLUCOSE: 147 mg/dL — AB (ref 65–99)
POTASSIUM: 3.5 mmol/L (ref 3.5–5.1)
Sodium: 138 mmol/L (ref 135–145)

## 2014-12-04 LAB — HEPARIN LEVEL (UNFRACTIONATED)
Heparin Unfractionated: 0.8 IU/mL — ABNORMAL HIGH (ref 0.30–0.70)
Heparin Unfractionated: 0.83 IU/mL — ABNORMAL HIGH (ref 0.30–0.70)

## 2014-12-04 LAB — NM MYOCAR MULTI W/SPECT W/WALL MOTION / EF
CHL CUP RESTING HR STRESS: 73 {beats}/min
CSEPEDS: 0 s
CSEPEW: 1 METS
Exercise duration (min): 0 min
MPHR: 160 {beats}/min
Peak HR: 93 {beats}/min
Percent HR: 58 %

## 2014-12-04 LAB — TROPONIN I: Troponin I: 1.78 ng/mL (ref <0.031)

## 2014-12-04 MED ORDER — METOCLOPRAMIDE HCL 10 MG PO TABS
5.0000 mg | ORAL_TABLET | Freq: Three times a day (TID) | ORAL | Status: DC
  Administered 2014-12-04 – 2014-12-05 (×3): 5 mg via ORAL
  Filled 2014-12-04 (×3): qty 1

## 2014-12-04 MED ORDER — HEPARIN (PORCINE) IN NACL 100-0.45 UNIT/ML-% IJ SOLN: 800.0000 [IU]/h | INTRAMUSCULAR | Status: DC

## 2014-12-04 MED ORDER — REGADENOSON 0.4 MG/5ML IV SOLN
INTRAVENOUS | Status: AC
  Filled 2014-12-04: qty 5

## 2014-12-04 MED ORDER — REGADENOSON 0.4 MG/5ML IV SOLN
0.4000 mg | Freq: Once | INTRAVENOUS | Status: AC
  Administered 2014-12-04: 0.4 mg via INTRAVENOUS
  Filled 2014-12-04: qty 5

## 2014-12-04 MED ORDER — TECHNETIUM TC 99M SESTAMIBI - CARDIOLITE
30.0000 | Freq: Once | INTRAVENOUS | Status: AC | PRN
  Administered 2014-12-04: 12:00:00 30 via INTRAVENOUS

## 2014-12-04 MED ORDER — REGADENOSON 0.4 MG/5ML IV SOLN
0.4000 mg | Freq: Once | INTRAVENOUS | Status: DC
  Filled 2014-12-04: qty 5

## 2014-12-04 MED ORDER — INSULIN ASPART 100 UNIT/ML ~~LOC~~ SOLN
3.0000 [IU] | Freq: Once | SUBCUTANEOUS | Status: AC
  Administered 2014-12-04: 3 [IU] via SUBCUTANEOUS

## 2014-12-04 MED ORDER — PROMETHAZINE HCL 25 MG/ML IJ SOLN
12.5000 mg | Freq: Once | INTRAMUSCULAR | Status: AC
  Administered 2014-12-04: 12.5 mg via INTRAVENOUS

## 2014-12-04 MED ORDER — REGADENOSON 0.4 MG/5ML IV SOLN: 0.4000 mg | Freq: Once | INTRAVENOUS | Status: DC

## 2014-12-04 MED ORDER — TECHNETIUM TC 99M SESTAMIBI GENERIC - CARDIOLITE
10.0000 | Freq: Once | INTRAVENOUS | Status: AC | PRN
  Administered 2014-12-04: 10 via INTRAVENOUS

## 2014-12-04 MED ORDER — PROMETHAZINE HCL 25 MG/ML IJ SOLN
INTRAMUSCULAR | Status: AC
  Filled 2014-12-04: qty 1

## 2014-12-04 NOTE — Progress Notes (Signed)
PT Cancellation Note  Patient Details Name: Bradley [name] MRN: [id number] DOB: 1955-12-16   Cancelled Treatment:    Reason Eval/Treat Not Completed: Patient at procedure or test/unavailable (pt at [facility] for stress test. Will attempt Eval tomorrow. )   Ralene Bathe Kistler 12/04/2014, 1:01 PM [phone]

## 2014-12-04 NOTE — Progress Notes (Addendum)
ANTICOAGULATION CONSULT NOTE - Follow Up  Pharmacy Consult for Heparin Indication: chest pain/ACS  - Heparin level = 0.83 drawn from LEFT hand (goal 0.3 - 0.7). - No labs can be drawn from RIGHT arm due to AV fistula. - IV heparin infusion 950 units/hr via peripheral infusion in LEFT arm.   - No bleeding reported per RN  A/P: Heparin level > goal but may be slightly skewed by location of lab draw.  decrease heparin to 800 units/hr  recheck heparin level in 8hrs  Loralee Pacas, PharmD, BCPS Pager: [phone] 12/04/2014 4:28 PM  Addendum: Sherron Monday with Dr. Elease Hashimoto, plan is to d/c heparin and continue plavix (see addendum to Cardiology note from this AM)  Loralee Pacas, PharmD, BCPS 12/04/2014 4:46 PM

## 2014-12-04 NOTE — Progress Notes (Signed)
PROGRESS NOTE  Bradley [name] EAV:[id number] DOB: August 27, 1955 DOA: 12/02/2014 PCP: No primary care provider on file.  HPI/Recap of past 24 hours:  Returned from stress test, denies sob at rest, no dizziness, sister and brother in room, sister reported patient has chronic n/v.  Assessment/Plan: Active Problems:   Pulmonary edema   Heart failure (HCC)   Anemia   Renal failure   Elevated troponin   NSTEMI (non-ST elevated myocardial infarction) (HCC)   CKD (chronic kidney disease), stage III   FTT (failure to thrive) in adult  NSTEMI:  Chest pain with elevation of troponin, on heparin drip, troponin trending down, s/p stress test , due to small area of reversibility with high risk of contrast nephropathy, cardiology recommended medical management, stop heparin drip, started plavix. Appreciate cardiology input.  Ischemic cardiomyopathy, last known EF 20% s/p ICD, poor renal function and low bp limits meds choice, on low dose coreg.  Chronic low bp: on midodrine at home which is continued here.  H/o CKD III, cr likely close to baseline, patient is closed followed by nephrology and fistula placed in anticipating HD in the near futher  IDDM2: adjust insulin  Chronic n/v, gastroparesis? ab [place] pending, trial of reglan  FTT; very weak and frail, likely will need SNF  Code Status: full  Family Communication: patient ,sister and brother in room  Disposition Plan: pending, likely snf   Consultants:  cardiology  Procedures:  Stress test 10/20  Antibiotics:  none   Objective: BP 100/51 mmHg  Pulse 73  Temp(Src) 97.7 F (36.5 C) (Oral)  Resp 18  Ht  (1.702 m)  Wt 129 lb 6.4 oz (58.695 kg)  BMI 20.26 kg/m2  SpO2 96%  Intake/Output Summary (Last 24 hours) at 12/04/14 1523 Last data filed at 12/04/14 0900  Gross per 24 hour  Intake    885 ml  Output    725 ml  Net    160 ml   Filed Weights   12/02/14 1041 12/04/14 0550  Weight: 130 lb (58.968 kg) 129 lb  6.4 oz (58.695 kg)    Exam:   General:  NAD, but very frail  Cardiovascular: RRR  Respiratory: CTABL  Abdomen: Soft/ND/NT, positive BS  Musculoskeletal: No Edema  Neuro: aaox3  Data Reviewed: Basic Metabolic Panel:  Recent Labs Lab 12/02/14 0401 12/02/14 0603 12/02/14 0903 12/03/14 1138 12/04/14 0453  NA 142 141  --  136 138  K 3.8 3.4*  --  3.4* 3.5  CL 103 102  --  97* 101  CO2 28  --   --  27 28  GLUCOSE 94 93  --  258* 147*  BUN 70* 63*  --  65* 67*  CREATININE 2.81* 2.80* 2.68* 2.92* 2.90*  CALCIUM 9.9  --   --  9.4 9.2  MG  --   --  2.1  --   --    Liver Function Tests:  Recent Labs Lab 12/02/14 0401 12/03/14 1138  AST 93* 42*  ALT 97* 57  ALKPHOS 102 92  BILITOT 1.0 1.3*  PROT 7.6 6.4*  ALBUMIN 4.2 3.7    Recent Labs Lab 12/02/14 0401  LIPASE 16*   No results for input(s): AMMONIA in the last 168 hours. CBC:  Recent Labs Lab 12/02/14 0401 12/02/14 0603 12/03/14 0535 12/04/14 0453  WBC 7.1  --  6.7 6.1  HGB 10.7* 10.2* 9.6* 9.7*  HCT 31.8* 30.0* 29.1* 29.2*  MCV 95.2  --  95.7 96.7  PLT  133*  --  106* 101*   Cardiac Enzymes:    Recent Labs Lab 12/02/14 1533 12/02/14 2031 12/03/14 1138 12/03/14 1743 12/03/14 2345  TROPONINI 1.48* 2.30* 2.05* 2.14* 1.78*   BNP (last 3 results)  Recent Labs  12/02/14 0401  BNP 1416.1*    ProBNP (last 3 results) No results for input(s): PROBNP in the last 8760 hours.  CBG:  Recent Labs Lab 12/03/14 1159 12/03/14 1725 12/03/14 2241 12/04/14 0750 12/04/14 1424  GLUCAP 226* 146* 186* 108* 93    No results found for this or any previous visit (from the past 240 hour(s)).   Studies: Nm Myocar Multi W/spect W/wall Motion / Ef  12/04/2014  CLINICAL DATA:  Coronary artery disease. Nausea and dizziness. Previous myocardial infarct. EXAM: MYOCARDIAL IMAGING WITH SPECT (REST AND PHARMACOLOGIC-STRESS) GATED LEFT VENTRICULAR WALL MOTION STUDY LEFT VENTRICULAR EJECTION FRACTION  TECHNIQUE: Standard myocardial SPECT imaging was performed after resting intravenous injection of 10 mCi Tc-28m sestamibi. Subsequently, intravenous infusion of Lexiscan was performed under the supervision of the Cardiology staff. At peak effect of the drug, 30 mCi Tc-37m sestamibi was injected intravenously and standard myocardial SPECT imaging was performed. Quantitative gated imaging was also performed to evaluate left ventricular wall motion, and estimate left ventricular ejection fraction. COMPARISON:  None. FINDINGS: Perfusion: Large area of decreased activity is seen in the anterior, septal, and apical walls of the left ventricle, which is predominately fixed on stress and rest images. A small area of reversibility is suspected in the inferior portion the septal wall, suspicious for a small area of ischemia. Wall Motion: Anterior, septal, and inferior wall hypokinesis demonstrated. Moderate left ventricular dilatation. Left Ventricular Ejection Fraction: 33 % End diastolic volume 212 ml End systolic volume 143 ml IMPRESSION: 1. Large predominantly fixed perfusion defect involving the anterior, septal, and apical walls of the left ventral. Suspected small area of reversibility in the inferior portion the septal wall may represent a small area peri-infarct ischemia. 2. Anterior septal, and inferior wall hypokinesis. 3. Left ventricular ejection fraction 33% 4. High-risk stress test findings*. *2012 Appropriate Use Criteria for Coronary Revascularization Focused Update: J Am Coll Cardiol. 2012;59(9):857-881. http://content.dementiazones.com.aspx?articleid=[email or web address] Electronically Signed   By: Myles Rosenthal M.D.   On: 12/04/2014 14:08   US Abdomen Limited Ruq  12/04/2014  CLINICAL DATA:  Elevated liver function studies. EXAM: US ABDOMEN LIMITED - RIGHT UPPER QUADRANT COMPARISON:  None. FINDINGS: Gallbladder: No gallstones are identified. There is a 3 mm polyp noted. No wall thickening or pericholecystic  fluid. Negative sonographic Murphy sign. Common bile duct: Diameter: 5.7 mm Liver: Normal echogenicity without focal lesion or biliary dilatation. IMPRESSION: Normal sonographic appearance of the liver.  No focal lesions. Small gallbladder polyp but no gallstones or findings for acute cholecystitis. Normal caliber common bile duct. Electronically Signed   By: Rudie Meyer M.D.   On: 12/04/2014 14:48    Scheduled Meds: . aspirin EC  81 mg Oral Daily  . atorvastatin  10 mg Oral q1800  . calcitRIOL  0.5 mcg Oral Daily  . carvedilol  3.125 mg Oral BID WC  . clopidogrel  75 mg Oral Daily  . docusate sodium  100 mg Oral BID  . FLUoxetine  40 mg Oral Daily  . folic acid  1 mg Oral Daily  . insulin aspart  0-9 Units Subcutaneous TID WC  . insulin detemir  5 Units Subcutaneous BID  . metoCLOPramide  5 mg Oral TID AC  . midodrine  10 mg Oral TID WC  .  multivitamin with minerals  1 tablet Oral Daily  . Pancrelipase (Lip-Prot-Amyl)  12,000 Units Oral QAC supper  . Pancrelipase (Lip-Prot-Amyl)  6,000 Units Oral 2 times per day  . pantoprazole (PROTONIX) IV  40 mg Intravenous Q12H  . polyethylene glycol  17 g Oral BID  . sodium chloride  3 mL Intravenous Q12H  . sodium chloride  3 mL Intravenous Q12H  . thiamine  100 mg Oral Daily    Continuous Infusions: . heparin 950 Units/hr (12/04/14 1610)     Time spent:  Jamal Haskin MD, PhD  [facility] Pager [phone]. If 7PM-7AM, please contact night-coverage at [email or web address], password [facility] 12/04/2014, 3:23 PM  LOS: 2 days

## 2014-12-04 NOTE — Progress Notes (Signed)
ANTICOAGULATION CONSULT NOTE - Follow Up Consult  Pharmacy Consult for Heparin Indication: chest pain/ACS  No Known Allergies  Patient Measurements: Height: 5\' 7"  (170.2 cm) Weight: 129 lb 6.4 oz (58.695 kg) IBW/kg (Calculated) : 66.1 Heparin Dosing Weight: 59 kg  Vital Signs: Temp: 97.9 F (36.6 C) (10/20 0548) Temp Source: Oral (10/20 0548) BP: 93/44 mmHg (10/20 0548) Pulse Rate: 70 (10/20 0548)  Labs:  Recent Labs  12/02/14 0401 12/02/14 0514 12/02/14 0603 12/02/14 0903 12/02/14 1136  12/03/14 0535 12/03/14 1138 12/03/14 1743 12/03/14 2345 12/04/14 0453  HGB 10.7*  --  10.2*  --   --   --  9.6*  --   --   --  9.7*  HCT 31.8*  --  30.0*  --   --   --  29.1*  --   --   --  29.2*  PLT 133*  --   --   --   --   --  106*  --   --   --  101*  APTT  --   --   --   --  31  --   --   --   --   --   --   LABPROT  --  13.6  --   --   --   --   --   --   --   --   --   INR  --  1.02  --   --   --   --   --   --   --   --   --   HEPARINUNFRC  --   --   --   --   --   < > 0.32  --  0.40  --  0.80*  CREATININE 2.81*  --  2.80* 2.68*  --   --   --  2.92*  --   --  2.90*  TROPONINI  --   --   --  0.61*  --   < >  --  2.05* 2.14* 1.78*  --   < > = values in this interval not displayed.  Estimated Creatinine Clearance: 22.5 mL/min (by C-G formula based on Cr of 2.9).   Medications:  Infusions:  . heparin 1,000 Units/hr (12/03/14 1209)    Assessment: 59 y/o M with PMH of IDDM, CAD, MI, CKD not on HD, ICM s/p recent ICD insertion who presented to [facility] ED with SOB, dizziness, nausea, and CP. Troponins elevated in ED and EKG c/w coronary ischemia per Cardiology. Patient not on any anticoagulants PTA. Pharmacy consulted to assist with dosing of heparin infusion for ACS.  Today, 12/04/2014:  Heparin level now supra-therapeutic at 0.80 (likely d/t accumulation from renal insuff)  Hgb and plts are low but stable  Scr elevated but stable at 2.90  No bleeding documented  Goal of  Therapy:  Heparin level 0.3-0.7 units/ml Monitor platelets by anticoagulation protocol: Yes   Plan:   Reduce heparin drip to 950 units/hr  Check 6 hour heparin level  Daily heparin level and CBC  Monitor for bleeding  Plan for stress test on 10/20  Dorna Leitz, PharmD, BCPS 12/04/2014 7:22 AM

## 2014-12-04 NOTE — Progress Notes (Signed)
1 day lexiscan myoview completed. Patient had significant vomiting prior to arrival to nuc med. After lexiscan injection, had more vomiting, given 12.5mg  phenergan IV down in nuc med. Pending final result by [facility] radiology  Given low EF and elevated trop, would not be too surprised if nuc result is high risk and abnormal. Given his bad renal function and high risk for contrast nephropathy, will discuss with Dr. Elease Hashimoto later once result come out to decide further option.  Ramond Dial PA Pager: [phone]

## 2014-12-04 NOTE — Progress Notes (Addendum)
Noted myoview result:  IMPRESSION: 1. Large predominantly fixed perfusion defect involving the anterior, septal, and apical walls of the left ventral. Suspected small area of reversibility in the inferior portion the septal wall may represent a small area peri-infarct ischemia.  2. Anterior septal, and inferior wall hypokinesis.  3. Left ventricular ejection fraction 33%  4. High-risk stress test findings*.  Stress test came back high risk as expected, discussed with Dr. Elease Hashimoto, feels like this is consistent with prior infarct, risk of contrast nephropathy out weight any benefit of reperfusion given small area of reversibility seen on myoview. Dr. Elease Hashimoto is going to [facility] hospital to round and will see the pt soon. Please see Dr. Elease Hashimoto comment on today's progress note earlier today for more information.  Ramond Dial PA Pager: [phone]

## 2014-12-04 NOTE — Progress Notes (Addendum)
Patient Profile: 59 y/o male with overall poor health literacy, IDDM, CAD with reported severe multivessel disease treated medically in [place], CKD not on HD, and chronic systolic CHF/ICM s/p recent ICD insertion, presenting with an ACS.    Subjective: He has some chest pain and rec'd morphine.  Continues with pain but appears to be comfortable.   Objective: Vital signs in last 24 hours: Temp:  [97.4 F (36.3 C)-98.9 F (37.2 C)] 97.9 F (36.6 C) (10/20 0548) Pulse Rate:  [65-89] 70 (10/20 0824) Resp:  [16-18] 16 (10/20 0548) BP: (92-127)/(41-91) 105/50 mmHg (10/20 0824) SpO2:  [96 %-100 %] 96 % (10/20 0548) Weight:  [129 lb 6.4 oz (58.695 kg)] 129 lb 6.4 oz (58.695 kg) (10/20 0550) Weight change: -9.6 oz (-0.272 kg) Last BM Date: 12/03/14 Intake/Output from previous day: +263 10/19 0701 - 10/20 0700 In: 1365 [P.O.:720; I.V.:195; IV Piggyback:250] Out: 950 [Urine:950] Intake/Output this shift:    PE:  General:Pleasant affect, NAD- though some mild chest pain. Skin:Warm and dry, brisk capillary refill HEENT:normocephalic, sclera clear, mucus membranes moist Heart:S1S2 RRR without murmur, gallup, rub or click Lungs:clear without rales, rhonchi, or wheezes ZOX:WRUE, non tender, + BS, do not palpate liver spleen or masses Ext:no lower ext edema, 2+ pedal pulses, 2+ radial pulses Neuro:alert and oriented X 3, MAE, follows commands, + facial symmetry  tele:  SR  Lab Results:  Recent Labs  12/03/14 0535 12/04/14 0453  WBC 6.7 6.1  HGB 9.6* 9.7*  HCT 29.1* 29.2*  PLT 106* 101*   BMET  Recent Labs  12/03/14 1138 12/04/14 0453  NA 136 138  K 3.4* 3.5  CL 97* 101  CO2 27 28  GLUCOSE 258* 147*  BUN 65* 67*  CREATININE 2.92* 2.90*  CALCIUM 9.4 9.2    Recent Labs  12/03/14 1743 12/03/14 2345  TROPONINI 2.14* 1.78*     Hepatic Function Panel  Recent Labs  12/03/14 1138  PROT 6.4*  ALBUMIN 3.7  AST 42*  ALT 57  ALKPHOS 92  BILITOT 1.3*   No  results for input(s): CHOL in the last 72 hours. No results for input(s): PROTIME in the last 72 hours.     Studies/Results: No results found.  Medications: I have reviewed the patient's current medications. Scheduled Meds: . aspirin EC  81 mg Oral Daily  . atorvastatin  10 mg Oral q1800  . calcitRIOL  0.5 mcg Oral Daily  . carvedilol  3.125 mg Oral BID WC  . clopidogrel  75 mg Oral Daily  . docusate sodium  100 mg Oral BID  . FLUoxetine  40 mg Oral Daily  . folic acid  1 mg Oral Daily  . insulin aspart  0-9 Units Subcutaneous TID WC  . insulin detemir  5 Units Subcutaneous BID  . midodrine  10 mg Oral TID WC  . multivitamin with minerals  1 tablet Oral Daily  . Pancrelipase (Lip-Prot-Amyl)  12,000 Units Oral QAC supper  . Pancrelipase (Lip-Prot-Amyl)  6,000 Units Oral 2 times per day  . pantoprazole (PROTONIX) IV  40 mg Intravenous Q12H  . polyethylene glycol  17 g Oral BID  . regadenoson  0.4 mg Intravenous Once  . sodium chloride  3 mL Intravenous Q12H  . sodium chloride  3 mL Intravenous Q12H  . thiamine  100 mg Oral Daily   Continuous Infusions: . heparin 950 Units/hr (12/04/14 0823)   PRN Meds:.sodium chloride, acetaminophen **OR** acetaminophen, albuterol, bisacodyl, HYDROcodone-acetaminophen, LORazepam **OR** LORazepam,  morphine injection, nitroGLYCERIN, ondansetron **OR** ondansetron (ZOFRAN) IV, sodium chloride  Assessment/Plan: Active Problems:   Pulmonary edema   Heart failure (HCC)   Anemia   Renal failure   Elevated troponin   NSTEMI (non-ST elevated myocardial infarction) (HCC)   CKD (chronic kidney disease), stage III   FTT (failure to thrive) in adult  1. NSTEMI: troponin trend 0.16-->1.48--> 2.30-->2.05-->2.14-->1.78. Plan is for medical management for now given renal function. Continue on IV heparin with plans for stress test today,  HR and BP are both well controlled. Continue ASA, atorvastatin and Coreg. He was on Imdur as an OP, but BP is too  soft to 93 systolic, to restart. Continue to hold for now.  2. CAD: per his primary cardiologist in [facility], he has a chronic total occlusion of his RCA. The LAD is heavily calcified and diffusely diseased , Lcx has moderate disease by cath in August. The LAD calcification is such that normal stenting would not be an option but he would likely require rotoblator - and the contrast needed for that would likely cause complete renal failure. Will continue medical therapy as outlined above. stress test today   3. Ischemic Cardiomyopathy: EF by echo is 20-25%. He has an ICD. NSR on telemetry with controlled rate. No ventricular arrhthymias. Continue BB therapy with Coreg. His renal function prohibits use of ACE/ARB therapy. His BP will not tolerate addition of hydralazine + nitrate at this time.   4. Chronic Systolic CHF: EF 18-29%. Only tolerable HF med at this time, due to renal function and BP is Coreg. His volume appears stable. Will need to monitor closely. Low sodium diet. Since admit -1305  He had a dose of lasix on the 18th.    5. CKD: Scr ~2.9.  He is followed by a nephrologist in [place]. He has a fistula but not yet on HD.   6. IDDM: management per IM.   7. Anemia due to chronic disease. followed by IM   LOS: 2 days   Time spent with pt. : 30  minutes. [facility]  Nurse Practitioner Certified Pager [phone] or after 5pm and on weekends call [phone] 12/04/2014, 9:18 AM   Attending Note:   The patient was seen and examined.  Agree with assessment and plan as noted above.  Changes made to the above note as needed.  The myoview from today shows a large anterior wall scarr with a small amount of peri-infarct ischemia.    Given the extensive calcification and severe diffuse disease involving the LAD, I would not recommend rotoblator / PCI of the LAD as there would be little benefit .  His LV function actually appears a little better  Will DC heparin. We have started Plavix.   Will  sign off.  Call for questions   Please send copy of the DC to   Dr. Erlene Senters, Cardiologist.  Office Phone [phone]) Fax: [phone]  Vesta Mixer, Montez Hageman., MD, [facility] 12/04/2014, 3:30 PM [address]  [address] Office [phone] Pager [phone]

## 2014-12-05 LAB — COMPREHENSIVE METABOLIC PANEL
ALBUMIN: 3.7 g/dL (ref 3.5–5.0)
ALT: 53 U/L (ref 17–63)
ANION GAP: 8 (ref 5–15)
AST: 37 U/L (ref 15–41)
Alkaline Phosphatase: 96 U/L (ref 38–126)
BILIRUBIN TOTAL: 0.7 mg/dL (ref 0.3–1.2)
BUN: 72 mg/dL — ABNORMAL HIGH (ref 6–20)
CO2: 29 mmol/L (ref 22–32)
Calcium: 9.3 mg/dL (ref 8.9–10.3)
Chloride: 100 mmol/L — ABNORMAL LOW (ref 101–111)
Creatinine, Ser: 3.14 mg/dL — ABNORMAL HIGH (ref 0.61–1.24)
GFR calc Af Amer: 23 mL/min — ABNORMAL LOW (ref >60)
GFR calc non Af Amer: 20 mL/min — ABNORMAL LOW (ref >60)
GLUCOSE: 267 mg/dL — AB (ref 65–99)
POTASSIUM: 3.8 mmol/L (ref 3.5–5.1)
Sodium: 137 mmol/L (ref 135–145)
TOTAL PROTEIN: 6.5 g/dL (ref 6.5–8.1)

## 2014-12-05 LAB — TSH: TSH: 9.438 u[IU]/mL — ABNORMAL HIGH (ref 0.350–4.500)

## 2014-12-05 LAB — CBC
HEMATOCRIT: 29.1 % — AB (ref 39.0–52.0)
Hemoglobin: 9.6 g/dL — ABNORMAL LOW (ref 13.0–17.0)
MCH: 31.3 pg (ref 26.0–34.0)
MCHC: 33 g/dL (ref 30.0–36.0)
MCV: 94.8 fL (ref 78.0–100.0)
Platelets: 109 10*3/uL — ABNORMAL LOW (ref 150–400)
RBC: 3.07 MIL/uL — ABNORMAL LOW (ref 4.22–5.81)
RDW: 15 % (ref 11.5–15.5)
WBC: 5.2 10*3/uL (ref 4.0–10.5)

## 2014-12-05 LAB — MAGNESIUM: MAGNESIUM: 1.8 mg/dL (ref 1.7–2.4)

## 2014-12-05 LAB — GLUCOSE, CAPILLARY
GLUCOSE-CAPILLARY: 236 mg/dL — AB (ref 65–99)
GLUCOSE-CAPILLARY: 130 mg/dL — AB (ref 65–99)

## 2014-12-05 MED ORDER — METOCLOPRAMIDE HCL 5 MG PO TABS: 5.0000 mg | ORAL_TABLET | Freq: Three times a day (TID) | ORAL | Status: AC | PRN

## 2014-12-05 MED ORDER — NITROGLYCERIN 0.4 MG SL SUBL: 0.4000 mg | SUBLINGUAL_TABLET | SUBLINGUAL | Status: AC | PRN

## 2014-12-05 MED ORDER — INSULIN ASPART 100 UNIT/ML ~~LOC~~ SOLN
3.0000 [IU] | Freq: Three times a day (TID) | SUBCUTANEOUS | Status: DC
  Administered 2014-12-05: 3 [IU] via SUBCUTANEOUS

## 2014-12-05 MED ORDER — CLOPIDOGREL BISULFATE 75 MG PO TABS: 75.0000 mg | ORAL_TABLET | Freq: Every day | ORAL | Status: AC

## 2014-12-05 NOTE — Progress Notes (Addendum)
Spoke with pt and sister at bedside concerning discharge plans and needs. Pt will not be able to rec HHPT related to Medicaid guidelines. Pt asked for rollator walking, will check to see if pt qualifies for that. Pt's sister states in [place] pt is unable to rec [facility] at all related to Medicaid. Encouraged sister to make appointments with PCP and Cardiologist.  Also to check out Cardiac Rehab Program with Cardiologist. A call to Dr. Bubba Hales office was made with no answer, left VM.   Pt rec'd rollator on 10/04/12 and will not qualify for a RW or rollator with Medicaid.  Pt and sister was made aware. Instructed sister to call company concerning other rollator being broken.     Pt will need to go to Medical Records to sign for Medical Records and have faxed to MD.   Please send medical record, stress test result, cardiology note and discharge summary to   Dr. Erlene Senters, Cardiologist.  Office Phone [phone]) Fax: [phone]

## 2014-12-05 NOTE — Evaluation (Addendum)
Physical Therapy Evaluation Patient Details Name: Bradley [name] MRN: [id number] DOB: 09/15/1955 Today's Date: 12/05/2014   History of Present Illness  59 y.o. male from [place], [place] significant for CAD, S/P defibrillator place 11-19-14, NSTEMI August 2016,  CKD , S/P AV fistula, he is noton dialysis, he was on his way to [place] to take his brother to the airport when he started to feel sick. He report SOB, dizziness, nausea.  Clinical Impression  Pt admitted with above diagnosis. Pt currently with functional limitations due to the deficits listed below (see PT Problem List). Pt ambulated 74' with RW with supervision, distance limited by fatigue. His family reports he's had several months of weakness since MI in August. He would benefit from cardiac rehab to address weakness and decreased endurance.  Pt will benefit from skilled PT to increase their independence and safety with mobility to allow discharge to the venue listed below.       Follow Up Recommendations Other (comment) (cardiac rehab)    Equipment Recommendations  Other (comment) (rollator) (he has one that is broken)   Recommendations for Other Services       Precautions / Restrictions Precautions Precautions: Fall Precaution Comments: sister reports several falls about 5 months ago Restrictions Weight Bearing Restrictions: No      Mobility  Bed Mobility Overal bed mobility: Modified Independent             General bed mobility comments: with bedrail  Transfers Overall transfer level: Needs assistance Equipment used: Rolling walker (2 wheeled) Transfers: Sit to/from Stand Sit to Stand: Min guard         General transfer comment: min/guard for safety, pt uses momentum to rise, no LOB  Ambulation/Gait Ambulation/Gait assistance: Supervision Ambulation Distance (Feet): 55 Feet Assistive device: Rolling walker (2 wheeled) Gait Pattern/deviations: Step-through pattern;Decreased stride length   Gait  velocity interpretation: Below normal speed for age/gender General Gait Details: steady with RW, distance limited by fatigue, HR 80 with walking, supervision for safety  Stairs            Wheelchair Mobility    Modified Rankin (Stroke Patients Only)       Balance Overall balance assessment: History of Falls (steady with RW)                                           Pertinent Vitals/Pain Pain Assessment: No/denies pain    Home Living Family/patient expects to be discharged to:: Private residence Living Arrangements: Other relatives (patient from out of town-currently is staying with his sister) Available Help at Discharge: Family Type of Home: House Home Access: Ramped entrance     Home Layout: One level Home Equipment: Environmental consultant - 4 wheels;Shower seat (brakes don't work on Occupational hygienist)      Prior Function Level of Independence: Needs Geologist, engineering / Transfers Assistance Needed: has been walking short distances without AD since brakes on his rollator broke, sister at times helps steady him, sister reports pt has had several months of weakness  ADL's / Homemaking Assistance Needed: independent  Comments: was using rollator until brakes stopped working     Higher education careers adviser        Extremity/Trunk Assessment   Upper Extremity Assessment: Overall WFL for tasks assessed           Lower Extremity Assessment: Generalized weakness (B knee extension -4/5, ankle DF 5/5, hip  flexion +4/5)      Cervical / Trunk Assessment: Normal  Communication   Communication: No difficulties  Cognition Arousal/Alertness: Awake/alert Behavior During Therapy: WFL for tasks assessed/performed Overall Cognitive Status: Within Functional Limits for tasks assessed                      General Comments      Exercises        Assessment/Plan    PT Assessment Patient needs continued PT services  PT Diagnosis Generalized weakness;Difficulty walking    PT Problem List Decreased strength;Decreased activity tolerance;Decreased balance;Decreased mobility  PT Treatment Interventions Gait training;Functional mobility training;Therapeutic activities;Therapeutic exercise;Balance training;Patient/family education   PT Goals (Current goals can be found in the Care Plan section) Acute Rehab PT Goals Patient Stated Goal: to get stronger PT Goal Formulation: With patient/family Time For Goal Achievement: 12/19/14 Potential to Achieve Goals: Good    Frequency Min 3X/week   Barriers to discharge        Co-evaluation               End of Session Equipment Utilized During Treatment: Gait belt Activity Tolerance: Patient limited by fatigue Patient left: in chair;with call bell/phone within reach;with family/visitor present;with chair alarm set           Time: 1041-1110 PT Time Calculation (min) (ACUTE ONLY): 29 min   Charges:   PT Evaluation $Initial PT Evaluation Tier I: 1 Procedure PT Treatments $Gait Training: 8-22 mins   PT G Codes:        Tamala Ser 12/05/2014, 11:20 AM [phone]

## 2014-12-05 NOTE — Progress Notes (Signed)
CSW received referral for New SNF.  CSW reviewed chart and noted that PT evaluation recommends cardiac rehab outpatient.   RNCM confirmed that plan is for pt to return home and RNCM providing information to pt and pt family in order for pt to pursue cardiac rehab upon discharge.  No social work needs identified.  Inappropriate CSW referral.  CSW signing off.   Loletta Specter, MSW, LCSW Clinical Social Work [phone]

## 2014-12-05 NOTE — Discharge Summary (Addendum)
Discharge Summary  Bradley [name] WTU:[id number] DOB: 1955-10-23  PCP: No primary care provider on file.  Admit date: 12/02/2014 Discharge date: 12/05/2014  Time spent: >57mins  Recommendations for Outpatient Follow-up:  1. F/u with PMD within a week for hospital discharge follow up, pmd to continue monitor blood sugar control. 2.   F/u with cardiology , new meds: plavix. Dr. Erlene Senters, Cardiologist.  Office Phone [phone]) Fax: [phone]  Discharge Diagnoses:  Active Hospital Problems   Diagnosis Date Noted  . NSTEMI (non-ST elevated myocardial infarction) (HCC)   . CKD (chronic kidney disease), stage III   . FTT (failure to thrive) in adult   . Pulmonary edema 12/02/2014  . Heart failure (HCC) 12/02/2014  . Anemia 12/02/2014  . Renal failure 12/02/2014  . Elevated troponin 12/02/2014    Resolved Hospital Problems   Diagnosis Date Noted Date Resolved  No resolved problems to display.    Discharge Condition: stable  Diet recommendation: heart healthy/carb modified  Filed Weights   12/02/14 1041 12/04/14 0550 12/05/14 0500  Weight: 130 lb (58.968 kg) 129 lb 6.4 oz (58.695 kg) 133 lb 9.6 oz (60.601 kg)    History of present illness:  Bradley [name] is a 59 y.o. male from [place], PMH significant for CAD, S/P defibrillator place 10-05 CKD , S/P AV fistula, he is not On dialysis, he was on his way to [facility] to take his brother to the airport when he started to feel sick. He report SOB, dizziness, nausea. He denies chest pain, abdominal pain. Last BM was day prior to admission, no diarrhea. He has been compliant with his insulin, lasix. He denies drinking alcohol. His brother confirm that patient does not drink alcohol.  He had defibrillator place 11-19-2014. He report pain at defibrillator site.   Evaluation in the ED; Chest X ray: Mild vascular congestion noted. Increased interstitial markings raise concern for mild interstitial edema. BNP 1416,  troponin 0.49, Cr at 2.8. EKG with TW abnormalities inferior and anterior lead. No prior record  Available.   Hospital Course:  Active Problems:   Pulmonary edema   Heart failure (HCC)   Anemia   Renal failure   Elevated troponin   NSTEMI (non-ST elevated myocardial infarction) (HCC)   CKD (chronic kidney disease), stage III   FTT (failure to thrive) in adult  NSTEMI:  Chest pain with elevation of troponin, on heparin dripx3 days, troponin trending down, s/p stress test , due to small area of reversibility with high risk of contrast nephropathy, cardiology recommended medical management, started plavix. Continue follow up with cardiology in [place] closely.  Ischemic cardiomyopathy, last known EF 20% s/p ICD, poor renal function and low bp limits meds choice, on low dose coreg, not on acei. Received IV lasix in the ED, home dose lasix restarted at discharge, no edema, on room air at discharge  Chronic low bp: on midodrine at home which is continued here.  H/o CKD III, cr likely close to baseline, patient is closed followed by nephrology and fistula placed in anticipating HD in the near futher. Patient does has nephrologist in [place], he is advised to continue to follow with his nephrologist.  IDDM2: continue home dose insulin, pmd to continue monitor   Chronic n/v, gastroparesis? Though patient reported n/v always associated with his heart attack, normally he does not have n/v. ab US unremarkable, trial of prn reglan  FTT; very weak and frail, likely will need SNF, patient reported he has used up all his snf days  this year, want to go home.  Code Status: full  Family Communication: patient ,sister   Disposition Plan: home with home health    Consultants:  cardiology  Procedures:  Stress test 10/20  Antibiotics:  none  Discharge Exam: BP 104/43 mmHg  Pulse 67  Temp(Src) 98.4 F (36.9 C) (Oral)  Resp 18  Ht  (1.702 m)  Wt 133 lb 9.6 oz (60.601 kg)  BMI  20.92 kg/m2  SpO2 94%   General: NAD, but very frail  Cardiovascular: RRR  Respiratory: CTABL  Abdomen: Soft/ND/NT, positive BS  Musculoskeletal: No Edema  Neuro: aaox3   Discharge Instructions You were cared for by a hospitalist during your hospital stay. If you have any questions about your discharge medications or the care you received while you were in the hospital after you are discharged, you can call the unit and asked to speak with the hospitalist on call if the hospitalist that took care of you is not available. Once you are discharged, your primary care physician will handle any further medical issues. Please note that NO REFILLS for any discharge medications will be authorized once you are discharged, as it is imperative that you return to your primary care physician (or establish a relationship with a primary care physician if you do not have one) for your aftercare needs so that they can reassess your need for medications and monitor your lab values.     Medication List    TAKE these medications        aspirin EC 81 MG tablet  Take 81 mg by mouth daily.     atorvastatin 10 MG tablet  Commonly known as:  LIPITOR  Take 10 mg by mouth daily.     calcitRIOL 0.25 MCG capsule  Commonly known as:  ROCALTROL  Take 0.5 mcg by mouth daily.     carvedilol 3.125 MG tablet  Commonly known as:  COREG  Take 3.125 mg by mouth 2 (two) times daily with a meal.     clopidogrel 75 MG tablet  Commonly known as:  PLAVIX  Take 1 tablet (75 mg total) by mouth daily.     famotidine 20 MG tablet  Commonly known as:  PEPCID  Take 20 mg by mouth daily.     ferrous sulfate 325 (65 FE) MG tablet  Take 325 mg by mouth 2 (two) times daily with a meal.     FLUoxetine 40 MG capsule  Commonly known as:  PROZAC  Take 40 mg by mouth daily.     furosemide 20 MG tablet  Commonly known as:  LASIX  Take 20 mg by mouth daily.     insulin detemir 100 UNIT/ML injection  Commonly known  as:  LEVEMIR  Inject 7-9 Units into the skin 2 (two) times daily. 9 units in the morning and 7 units in the evening     isosorbide mononitrate 30 MG 24 hr tablet  Commonly known as:  IMDUR  Take 30 mg by mouth daily.     lipase/protease/amylase 16109 UNITS Cpep capsule  Commonly known as:  CREON  Take 6,000-12,000 Units by mouth 3 (three) times daily before meals. 2 capsules before largest meal (dinner) and 1 capsule before other meals     magnesium oxide 400 MG tablet  Commonly known as:  MAG-OX  Take 400 mg by mouth 2 (two) times daily.     metoCLOPramide 5 MG tablet  Commonly known as:  REGLAN  Take 1 tablet (5  mg total) by mouth every 8 (eight) hours as needed for nausea.     midodrine 5 MG tablet  Commonly known as:  PROAMATINE  Take 10 mg by mouth 3 (three) times daily with meals.     nitroGLYCERIN 0.4 MG SL tablet  Commonly known as:  NITROSTAT  Place 1 tablet (0.4 mg total) under the tongue every 5 (five) minutes as needed for chest pain.     NOVOLOG FLEXPEN 100 UNIT/ML FlexPen  Generic drug:  insulin aspart  Inject 5 Units into the skin 3 (three) times daily with meals. 5 units at each meal plus sliding scale.     potassium chloride 10 MEQ tablet  Commonly known as:  K-DUR  Take 10 mEq by mouth 2 (two) times daily.       No Known Allergies Follow-up Information    Please follow up.   Contact information:   f/u with pmd in a week for hospital discharge  follow up.  f/u with cardiology Dr. Erlene Senters in one to two weeks       The results of significant diagnostics from this hospitalization (including imaging, microbiology, ancillary and laboratory) are listed below for reference.    Significant Diagnostic Studies: Nm Myocar Multi W/spect W/wall Motion / Ef  12/04/2014  CLINICAL DATA:  Coronary artery disease. Nausea and dizziness. Previous myocardial infarct. EXAM: MYOCARDIAL IMAGING WITH SPECT (REST AND PHARMACOLOGIC-STRESS) GATED LEFT VENTRICULAR WALL  MOTION STUDY LEFT VENTRICULAR EJECTION FRACTION TECHNIQUE: Standard myocardial SPECT imaging was performed after resting intravenous injection of 10 mCi Tc-17m sestamibi. Subsequently, intravenous infusion of Lexiscan was performed under the supervision of the Cardiology staff. At peak effect of the drug, 30 mCi Tc-15m sestamibi was injected intravenously and standard myocardial SPECT imaging was performed. Quantitative gated imaging was also performed to evaluate left ventricular wall motion, and estimate left ventricular ejection fraction. COMPARISON:  None. FINDINGS: Perfusion: Large area of decreased activity is seen in the anterior, septal, and apical walls of the left ventricle, which is predominately fixed on stress and rest images. A small area of reversibility is suspected in the inferior portion the septal wall, suspicious for a small area of ischemia. Wall Motion: Anterior, septal, and inferior wall hypokinesis demonstrated. Moderate left ventricular dilatation. Left Ventricular Ejection Fraction: 33 % End diastolic volume 212 ml End systolic volume 143 ml IMPRESSION: 1. Large predominantly fixed perfusion defect involving the anterior, septal, and apical walls of the left ventral. Suspected small area of reversibility in the inferior portion the septal wall may represent a small area peri-infarct ischemia. 2. Anterior septal, and inferior wall hypokinesis. 3. Left ventricular ejection fraction 33% 4. High-risk stress test findings*. *2012 Appropriate Use Criteria for Coronary Revascularization Focused Update: J Am Coll Cardiol. 2012;59(9):857-881. http://content.dementiazones.com.aspx?articleid=[email or web address] Electronically Signed   By: Myles Rosenthal M.D.   On: 12/04/2014 14:08   Dg Abd Acute W/chest  12/02/2014  CLINICAL DATA:  Acute onset of nausea, vomiting and weakness. Initial encounter. EXAM: DG ABDOMEN ACUTE W/ 1V CHEST COMPARISON:  None. FINDINGS: The lungs are well-aerated. Mild vascular  congestion is noted. Increased interstitial markings raise concern for mild interstitial edema. There is no evidence of pleural effusion or pneumothorax. The cardiomediastinal silhouette is borderline normal in size. An AICD is noted overlying the left chest wall, with a single lead ending overlying the right ventricle. The visualized bowel gas pattern is unremarkable. Scattered stool and air are seen within the colon; there is no evidence of small bowel dilatation to suggest obstruction.  No free intra-abdominal air is identified on the provided decubitus view. No acute osseous abnormalities are seen; the sacroiliac joints are unremarkable in appearance. Diffuse vascular calcifications are seen. IMPRESSION: 1. Mild vascular congestion noted. Increased interstitial markings raise concern for mild interstitial edema. 2. Unremarkable bowel gas pattern; no free intra-abdominal air seen. Moderate amount of stool noted in the colon. 3. Diffuse vascular calcifications seen. Electronically Signed   By: Roanna Raider M.D.   On: 12/02/2014 06:14   US Abdomen Limited Ruq  12/04/2014  CLINICAL DATA:  Elevated liver function studies. EXAM: US ABDOMEN LIMITED - RIGHT UPPER QUADRANT COMPARISON:  None. FINDINGS: Gallbladder: No gallstones are identified. There is a 3 mm polyp noted. No wall thickening or pericholecystic fluid. Negative sonographic Murphy sign. Common bile duct: Diameter: 5.7 mm Liver: Normal echogenicity without focal lesion or biliary dilatation. IMPRESSION: Normal sonographic appearance of the liver.  No focal lesions. Small gallbladder polyp but no gallstones or findings for acute cholecystitis. Normal caliber common bile duct. Electronically Signed   By: Rudie Meyer M.D.   On: 12/04/2014 14:48    Microbiology: No results found for this or any previous visit (from the past 240 hour(s)).   Labs: Basic Metabolic Panel:  Recent Labs Lab 12/02/14 0401 12/02/14 0603 12/02/14 0903 12/03/14 1138  12/04/14 0453 12/05/14 0510  NA 142 141  --  136 138 137  K 3.8 3.4*  --  3.4* 3.5 3.8  CL 103 102  --  97* 101 100*  CO2 28  --   --  27 28 29   GLUCOSE 94 93  --  258* 147* 267*  BUN 70* 63*  --  65* 67* 72*  CREATININE 2.81* 2.80* 2.68* 2.92* 2.90* 3.14*  CALCIUM 9.9  --   --  9.4 9.2 9.3  MG  --   --  2.1  --   --  1.8   Liver Function Tests:  Recent Labs Lab 12/02/14 0401 12/03/14 1138 12/05/14 0510  AST 93* 42* 37  ALT 97* 57 53  ALKPHOS 102 92 96  BILITOT 1.0 1.3* 0.7  PROT 7.6 6.4* 6.5  ALBUMIN 4.2 3.7 3.7    Recent Labs Lab 12/02/14 0401  LIPASE 16*   No results for input(s): AMMONIA in the last 168 hours. CBC:  Recent Labs Lab 12/02/14 0401 12/02/14 0603 12/03/14 0535 12/04/14 0453 12/05/14 0510  WBC 7.1  --  6.7 6.1 5.2  HGB 10.7* 10.2* 9.6* 9.7* 9.6*  HCT 31.8* 30.0* 29.1* 29.2* 29.1*  MCV 95.2  --  95.7 96.7 94.8  PLT 133*  --  106* 101* 109*   Cardiac Enzymes:  Recent Labs Lab 12/02/14 1533 12/02/14 2031 12/03/14 1138 12/03/14 1743 12/03/14 2345  TROPONINI 1.48* 2.30* 2.05* 2.14* 1.78*   BNP: BNP (last 3 results)  Recent Labs  12/02/14 0401  BNP 1416.1*    ProBNP (last 3 results) No results for input(s): PROBNP in the last 8760 hours.  CBG:  Recent Labs Lab 12/04/14 0750 12/04/14 1424 12/04/14 2127 12/05/14 0746 12/05/14 1147  GLUCAP 108* 93 384* 236* 130*       Signed:  Kody Vigil MD, PhD  [facility] 12/05/2014, 1:21 PM

## 2014-12-05 NOTE — Progress Notes (Signed)
Call back from Dr. Vita Barley, RN states Dr. Bubba Hales is going to refer pt to another MD for Cardiac Program.

## 2014-12-06 LAB — HEPATITIS PANEL, ACUTE
HCV Ab: <0.1 s/co ratio (ref 0.0–0.9)
HEP B S AG: NEGATIVE
Hep A IgM: NEGATIVE
Hep B C IgM: NEGATIVE

## 2014-12-06 LAB — HEMOGLOBIN A1C
Hgb A1c MFr Bld: 9.2 % — ABNORMAL HIGH (ref 4.8–5.6)
Mean Plasma Glucose: 217 mg/dL

## 2017-03-25 IMAGING — CR DG ABDOMEN ACUTE W/ 1V CHEST
5 series · 5 of 5 positions shown · non-contrast
Comparison: None.

CLINICAL DATA: Acute onset of nausea, vomiting and weakness.
Initial encounter.

EXAM:
DG ABDOMEN ACUTE W/ 1V CHEST

[w abdomen decub]
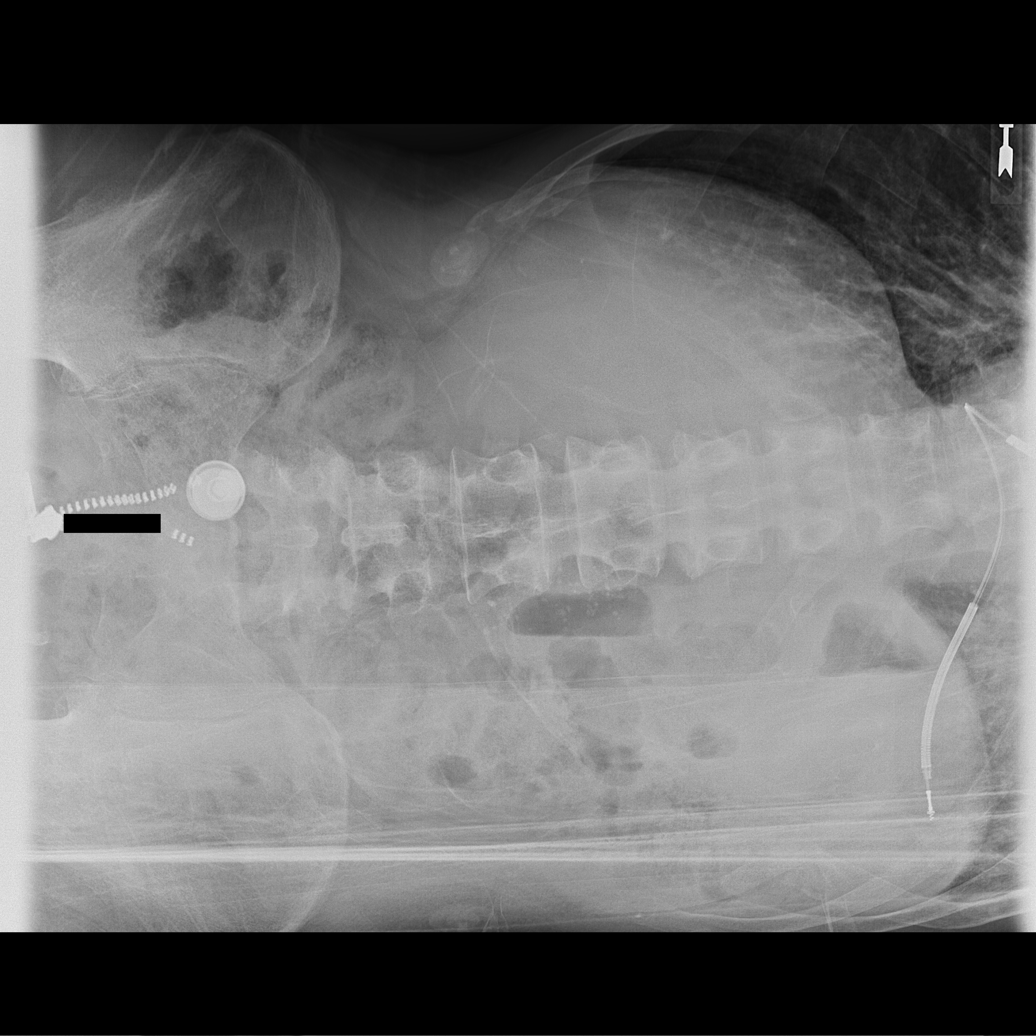

[x chest ap (1 of 2)]
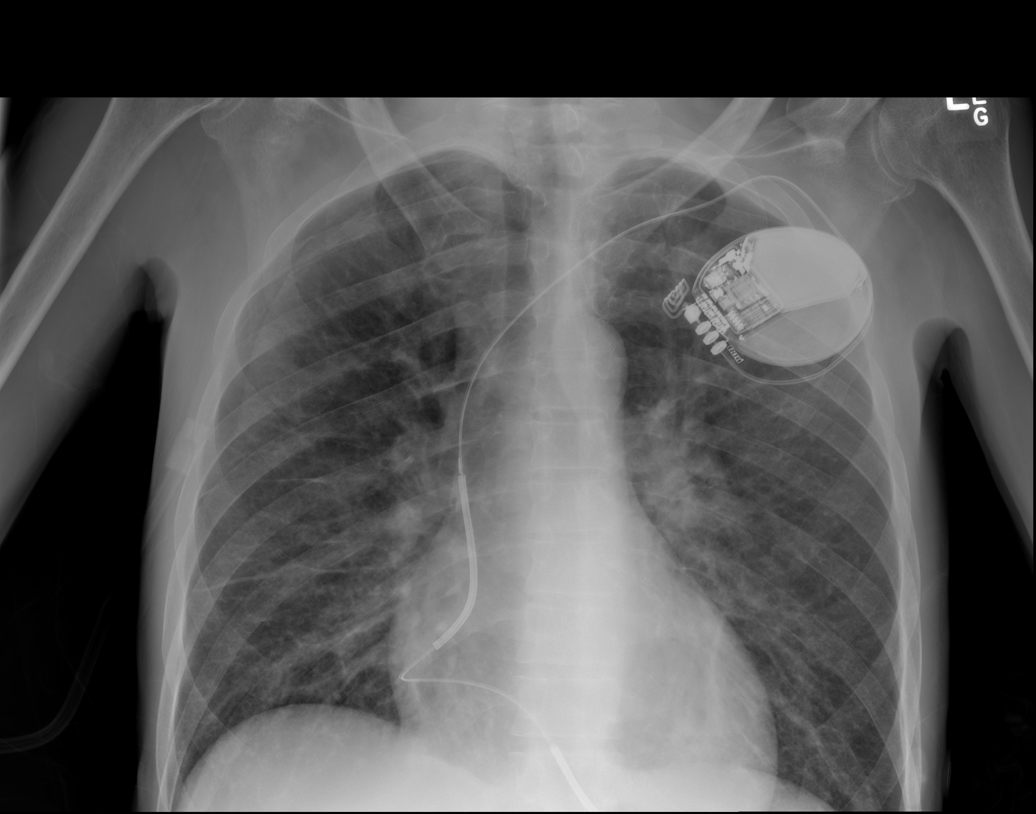

[x chest ap (2 of 2)]
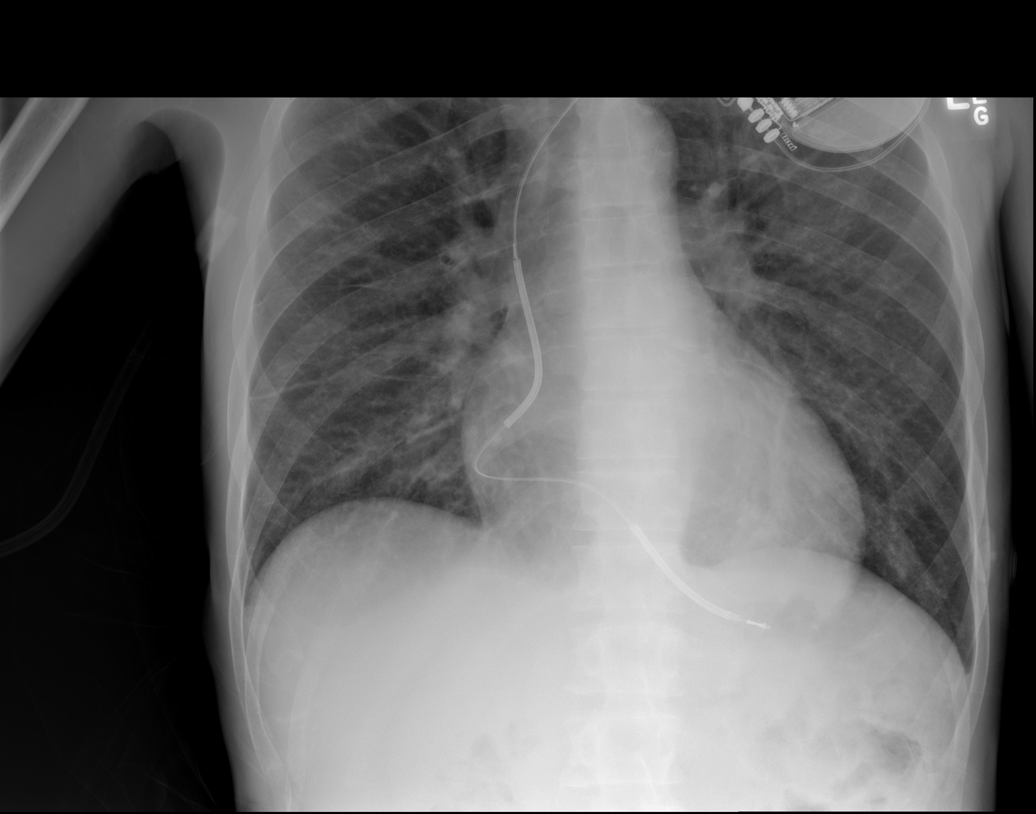

[x abdomen supine (1 of 2)]
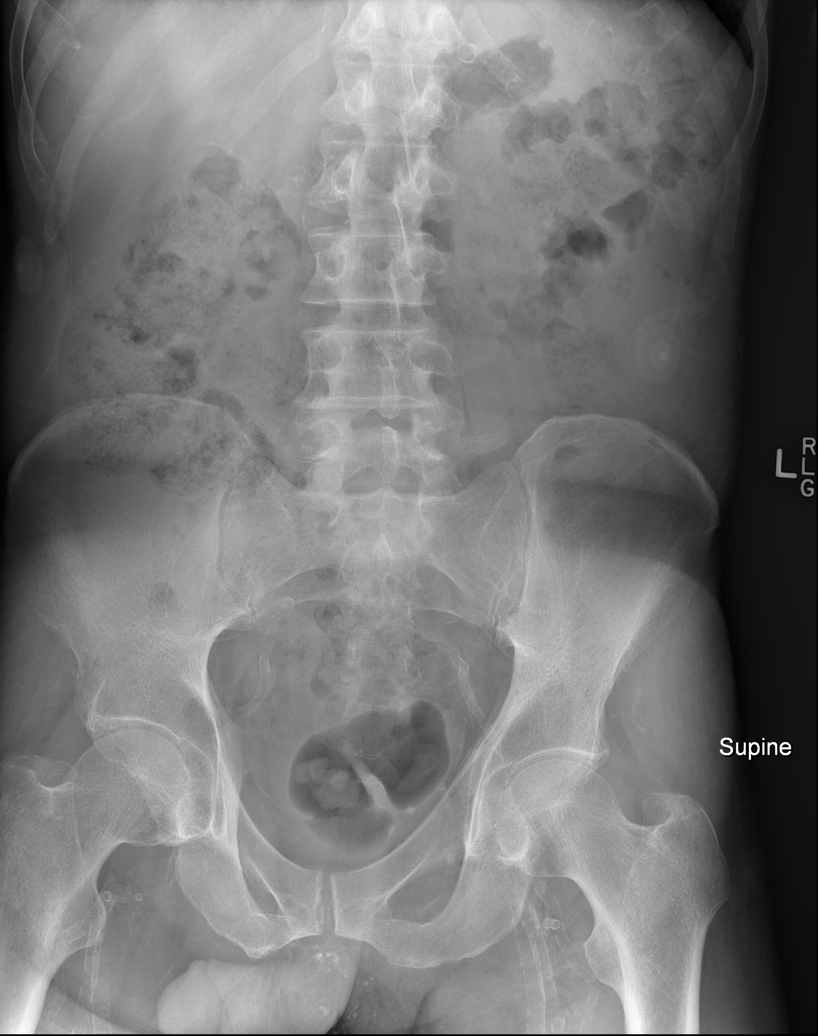

[x abdomen supine (2 of 2)]
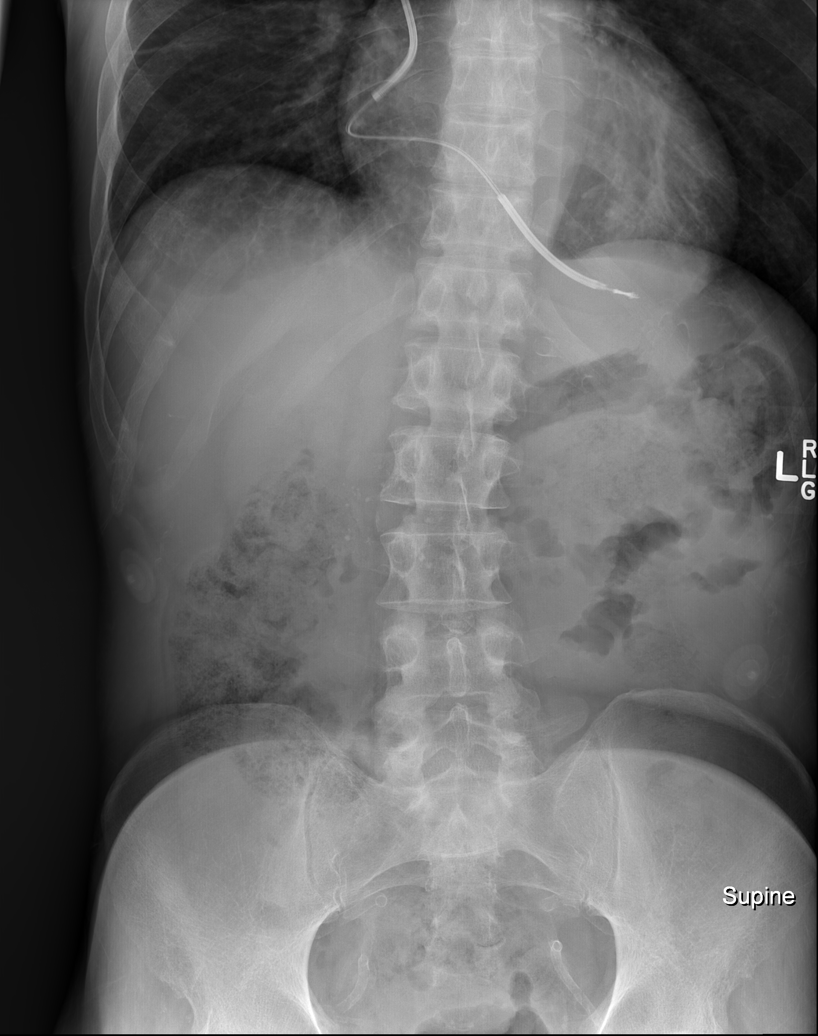

[5 of 5 positions shown; findings below may reference images not displayed]

FINDINGS: The lungs are well-aerated. Mild vascular congestion is noted.
Increased interstitial markings raise concern for mild interstitial
edema. There is no evidence of pleural effusion or pneumothorax. The
cardiomediastinal silhouette is borderline normal in size. An AICD
is noted overlying the left chest wall, with a single lead ending
overlying the right ventricle.

The visualized bowel gas pattern is unremarkable. Scattered stool
and air are seen within the colon; there is no evidence of small
bowel dilatation to suggest obstruction. No free intra-abdominal air
is identified on the provided decubitus view.

No acute osseous abnormalities are seen; the sacroiliac joints are
unremarkable in appearance. Diffuse vascular calcifications are
seen.
IMPRESSION: 1. Mild vascular congestion noted. Increased interstitial markings
raise concern for mild interstitial edema.
2. Unremarkable bowel gas pattern; no free intra-abdominal air seen.
Moderate amount of stool noted in the colon.
3. Diffuse vascular calcifications seen.

## 2018-08-15 DEATH — deceased
